# Patient Record
Sex: Female | Born: 1995 | Race: Black or African American | Hispanic: No | Marital: Single | Smoking: Current every day smoker
Health system: Southern US, Community
[De-identification: ages and names within clinical notes are randomized; demographics above are authoritative.]

## PROBLEM LIST (undated history)

## (undated) DIAGNOSIS — E669 Obesity, unspecified: Secondary | ICD-10-CM

## (undated) DIAGNOSIS — G43909 Migraine, unspecified, not intractable, without status migrainosus: Secondary | ICD-10-CM

---
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
## 2017-02-02 ENCOUNTER — Ambulatory Visit: Payer: Medicaid Other | Admitting: Family Medicine

## 2017-02-04 ENCOUNTER — Other Ambulatory Visit: Payer: Self-pay | Admitting: Family Medicine

## 2017-02-25 ENCOUNTER — Encounter: Payer: Self-pay | Admitting: Family Medicine

## 2017-02-25 ENCOUNTER — Ambulatory Visit (INDEPENDENT_AMBULATORY_CARE_PROVIDER_SITE_OTHER): Payer: Medicaid Other | Admitting: Family Medicine

## 2017-02-25 VITALS — BP 110/64 | HR 81 | Temp 98.7°F | Wt 255.0 lb

## 2017-02-25 DIAGNOSIS — R7309 Other abnormal glucose: Secondary | ICD-10-CM | POA: Diagnosis not present

## 2017-02-25 DIAGNOSIS — L309 Dermatitis, unspecified: Secondary | ICD-10-CM | POA: Diagnosis not present

## 2017-02-25 DIAGNOSIS — M722 Plantar fascial fibromatosis: Secondary | ICD-10-CM | POA: Diagnosis not present

## 2017-02-25 LAB — POCT GLYCOSYLATED HEMOGLOBIN (HGB A1C): HEMOGLOBIN A1C: 5.9

## 2017-02-25 MED ORDER — IBUPROFEN 800 MG PO TABS: 800.0000 mg | ORAL_TABLET | Freq: Three times a day (TID) | ORAL | 0 refills | Status: DC | PRN

## 2017-02-25 MED ORDER — PREDNISONE 10 MG PO TABS: 40.0000 mg | ORAL_TABLET | Freq: Every day | ORAL | 0 refills | Status: DC

## 2017-02-25 NOTE — Assessment & Plan Note (Signed)
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 Since patient is already on zyrtec and singular and has been compliant on supportive care, will given steroids - Prednisone 40mg  qd with taper down over the following 3 weeks.

## 2017-02-25 NOTE — Patient Instructions (Addendum)
It was great to see you again!  For your eczema,  - Please take prednisone 40mg  daily for 1 week, then taper down. Start taper: 30mg  for 1 week, then 20mg  for 1 week, then 10mg  for 1 week - Keep taking zyrtec and singular as you have been.  For your plantar fasciitis, - Stretching is important, wear supportive insoles.  - Take ibuprofen as needed  Make a appointment with your PCP to discuss your concerns about being checked for diabetes. In preparation, we got some bloodwork today.  Take care and seek immediate care sooner if you develop any concerns.   Dr. Leland HerElsia J Joshau Code, DO [facility]

## 2017-02-25 NOTE — Progress Notes (Signed)
    Subjective:  Cheryl Bowen is a 21 y.o. female who [facility] today with a chief complaint of itching.   HPI:  Itching Has been "itching all over" for the past 2 weeks. Has eczema at baseline and this feels worse than her usual. States her allergies have flared up recently with the increase in pollen. Has been taking zyrtec and benadryl nightly at bedtime. Has old prescription of kenalog that she has been using diffusely over her body. Otherwise has continued to use Aveeno lotion. No new soaps/detergents/body products. No difficulty breathing or swelling of tongue/lips.  Pain in feet Has had sharp foot pain in bottom of her feet for the past 3 weeks. Starts at heel and radiates to the balls of her feet. Has attempted to wear supportive shoes but due to pain has only been able to wear slip on sandals. No numbness/tingling. No falls.    ROS: Per HPI  Objective:  Physical Exam: BP 110/64   Pulse 81   Temp 98.7 F (37.1 C) (Oral)   Wt 255 lb (115.7 kg)   LMP 02/17/2017 (Approximate)   SpO2 99%   BMI 48.18 kg/m   Gen: NAD, resting comfortably MSK: no edema, cyanosis, or clubbing noted. TTP over heels on b/l feet. No erythema. Skin: warm, dry. Fine papular rash with lichenification over extremities with hyper ketotic lines on palms coupled with fine vesicular eruption. Also with fine vesicular eruption on inner arch of b/l feet.  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 Since patient is already on zyrtec and singular and has been compliant on supportive care, will given steroids - Prednisone 40mg  qd with taper down over the following 3 weeks.   Plantar fasciitis Exam consistent with plantar fasciitis given patient's heel pain.  - PRN ibuprofen - Recommended stretches and supportive care   Leland HerElsia J Merric Yost, DO PGY-1, [facility] 02/25/2017 1:51 PM

## 2017-02-25 NOTE — Assessment & Plan Note (Signed)
Exam consistent with plantar fasciitis given patient's heel pain.  - PRN ibuprofen - Recommended stretches and supportive care

## 2017-03-04 ENCOUNTER — Other Ambulatory Visit: Payer: Self-pay | Admitting: Family Medicine

## 2017-05-04 ENCOUNTER — Other Ambulatory Visit: Payer: Self-pay | Admitting: *Deleted

## 2017-05-04 MED ORDER — CETIRIZINE HCL 10 MG PO TABS: 10.0000 mg | ORAL_TABLET | Freq: Every day | ORAL | 3 refills | Status: DC

## 2017-06-22 ENCOUNTER — Other Ambulatory Visit: Payer: Self-pay | Admitting: Family Medicine

## 2017-06-22 DIAGNOSIS — M722 Plantar fascial fibromatosis: Secondary | ICD-10-CM

## 2017-06-30 ENCOUNTER — Ambulatory Visit: Payer: Medicaid Other | Admitting: Family Medicine

## 2017-07-19 ENCOUNTER — Other Ambulatory Visit: Payer: Self-pay | Admitting: Family Medicine
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
## 2017-07-22 ENCOUNTER — Encounter: Payer: Self-pay | Admitting: Family Medicine

## 2017-07-22 ENCOUNTER — Ambulatory Visit (INDEPENDENT_AMBULATORY_CARE_PROVIDER_SITE_OTHER): Payer: Medicaid Other | Admitting: Family Medicine

## 2017-07-22 VITALS — BP 120/80 | HR 95 | Temp 98.8°F | Wt 238.0 lb

## 2017-07-22 DIAGNOSIS — Z23 Encounter for immunization: Secondary | ICD-10-CM | POA: Diagnosis not present

## 2017-07-22 DIAGNOSIS — L732 Hidradenitis suppurativa: Secondary | ICD-10-CM | POA: Diagnosis not present

## 2017-07-22 MED ORDER — DOXYCYCLINE HYCLATE 100 MG PO TABS: 100.0000 mg | ORAL_TABLET | Freq: Two times a day (BID) | ORAL | 0 refills | Status: DC

## 2017-07-22 NOTE — Progress Notes (Signed)
    Subjective:    Patient ID: Cheryl Bowen, female    DOB: [birth year], 21 y.o.   MRN: [id number]   CC: boils under arms  Patient has chronic hidradenitis. Has seen derm recently several times but stopped going as he "only gave me more antibiotics and never did anything for it". Per patient she has had a lot of pain and swelling under both arms for the past week or so. She is out of doxycycline. Has been using warm compresses, boils drained a little last night providing some relief however the area is still extremely tender and uncomfortable. She is a smoker. She does not shave her armpits. Uses spray on deodorant as anything else hurts too much.   She also has URI symptoms and a cough. No wheezing. No fevers.   Smoking status reviewed- current every day smoker  Review of Systems- no fevers or chills   Objective:  BP 120/80   Pulse 95   Temp 98.8 F (37.1 C) (Oral)   Wt 238 lb (108 kg)   LMP 07/13/2017   SpO2 97%   BMI 44.97 kg/m  Vitals and nursing note reviewed  General: well nourished, in no acute distress HEENT: normocephalic, TM's visualized bilaterally, no scleral icterus or conjunctival pallor, no nasal discharge, moist mucous membranes, good dentition without erythema or discharge noted in posterior oropharynx Neck: supple, non-tender, without lymphadenopathy Cardiac: RRR, clear S1 and S2, no murmurs, rubs, or gallops Respiratory: clear to auscultation bilaterally, no increased work of breathing Extremities: no edema or cyanosis. Skin: warm and dry. Multiple draining pustules in axilla bilaterally with evidence of tracking, swelling, erythema. Tender to palpation.  Neuro: alert and oriented, no focal deficits  Assessment & Plan:    Hidradenitis  Clearly Hurley stage 2 if not 3. Chronic problem since patient was 37-58 years old. Evidence of scarring in bilateral axilla, under breast. Limited amount of normal skin present in axilla. Currently swollen, tender, draining  purulent material.   -refilled doxycycline for 10 day course -advised tylenol, ibuprofen for pain -continue warm compresses, ice for swelling -advised hibiclens to be used in shower weekly -referral made to surgery to discuss possible excision in future -encouraged tobacco cessation, weight loss -follow up as needed with PCP  Return if symptoms worsen or fail to improve.   Dolores Patty, DO Family Medicine Resident PGY-2

## 2017-07-22 NOTE — Patient Instructions (Addendum)
  Please get Hibiclens from any pharmacy and use this on the affected areas once a week in the shower.  We know that quitting smoking and losing weight help cut down infections.   I have referred you to a surgeon to discuss options for more treatment.  Please return to be seen if you develop fevers or chills.  If you have questions or concerns please do not hesitate to call at [phone].  Dolores Patty, DO PGY-2, [facility] 07/22/2017 4:03 PM

## 2017-07-22 NOTE — Assessment & Plan Note (Addendum)
  Clearly Hurley stage 2 if not 3. Chronic problem since patient was 22-19 years old. Evidence of scarring in bilateral axilla, under breast. Limited amount of normal skin present in axilla. Currently swollen, tender, draining purulent material.   -refilled doxycycline for 10 day course -advised tylenol, ibuprofen for pain -continue warm compresses, ice for swelling -advised hibiclens to be used in shower weekly -referral made to surgery to discuss possible excision in future -encouraged tobacco cessation, weight loss -follow up as needed with PCP

## 2017-12-16 ENCOUNTER — Other Ambulatory Visit: Payer: Self-pay | Admitting: *Deleted

## 2017-12-16 MED ORDER — BECLOMETHASONE DIPROP HFA 80 MCG/ACT IN AERB: 1.0000 | INHALATION_SPRAY | Freq: Two times a day (BID) | RESPIRATORY_TRACT | 1 refills | Status: DC

## 2017-12-20 ENCOUNTER — Telehealth: Payer: Self-pay

## 2017-12-20 NOTE — Telephone Encounter (Signed)
Patients insurance is requiring a PA on Qvar- Flovent and Pulmicort are preferred. Do you want to change to a preferred drug or attempt PA? Shawna OrleansMeredith B Thomsen, RN

## 2017-12-21 ENCOUNTER — Other Ambulatory Visit: Payer: Self-pay | Admitting: Family Medicine

## 2017-12-21 MED ORDER — BUDESONIDE 180 MCG/ACT IN AEPB: 2.0000 | INHALATION_SPRAY | Freq: Two times a day (BID) | RESPIRATORY_TRACT | 2 refills | Status: DC

## 2017-12-21 NOTE — Telephone Encounter (Signed)
Pulmicort send to pharmacy   Thank you   Lovena NeighboursAbdoulaye Lesha Jager, MD [facility], PGY-2

## 2017-12-21 NOTE — Telephone Encounter (Signed)
Meredith,   Let's do Pulmicort, so patientget have a medication instead of attempting PA for Qvar.  Thank you  Lovena NeighboursAbdoulaye Valerie Fredin, MD [facility], PGY-2

## 2017-12-21 NOTE — Telephone Encounter (Signed)
Can you send in a new Rx for pulmicort to [facility]? Thanks!! Shawna OrleansMeredith B Thomsen, RN

## 2017-12-22 ENCOUNTER — Telehealth: Payer: Self-pay

## 2017-12-22 ENCOUNTER — Other Ambulatory Visit: Payer: Self-pay | Admitting: Family Medicine

## 2017-12-22 MED ORDER — FLUTICASONE PROPIONATE HFA 110 MCG/ACT IN AERO: 2.0000 | INHALATION_SPRAY | Freq: Two times a day (BID) | RESPIRATORY_TRACT | 3 refills | Status: DC

## 2017-12-22 NOTE — Telephone Encounter (Signed)
Sharyl NimrodMeredith,  I will send flovent HFA inhaler.  Thanks   Lovena NeighboursAbdoulaye Creedon Danielski, MD [facility], PGY-2

## 2017-12-22 NOTE — Telephone Encounter (Signed)
Per patients insurance- will cover pulmicort for nebulizer but not the flexhaler. Does cover Flovent HFA inhaler. Do you want to send in a new Rx for Flovent or attempt a PA? Thanks! Shawna Orleans, RN

## 2018-02-06 ENCOUNTER — Other Ambulatory Visit: Payer: Self-pay | Admitting: Family Medicine

## 2018-02-07 ENCOUNTER — Other Ambulatory Visit: Payer: Self-pay

## 2018-02-07 MED ORDER — MONTELUKAST SODIUM 10 MG PO TABS: ORAL_TABLET | ORAL | 1 refills | Status: DC

## 2018-03-24 ENCOUNTER — Emergency Department (HOSPITAL_BASED_OUTPATIENT_CLINIC_OR_DEPARTMENT_OTHER): Payer: Medicaid Other

## 2018-03-24 ENCOUNTER — Emergency Department (HOSPITAL_BASED_OUTPATIENT_CLINIC_OR_DEPARTMENT_OTHER)
Admission: EM | Admit: 2018-03-24 | Discharge: 2018-03-24 | Disposition: A | Discharge status: Home / Self Care | Payer: Medicaid Other | Attending: Emergency Medicine | Admitting: Emergency Medicine

## 2018-03-24 ENCOUNTER — Other Ambulatory Visit: Payer: Self-pay

## 2018-03-24 ENCOUNTER — Encounter (HOSPITAL_BASED_OUTPATIENT_CLINIC_OR_DEPARTMENT_OTHER): Payer: Self-pay | Admitting: Emergency Medicine

## 2018-03-24 DIAGNOSIS — R05 Cough: Secondary | ICD-10-CM | POA: Diagnosis present

## 2018-03-24 DIAGNOSIS — Z79899 Other long term (current) drug therapy: Secondary | ICD-10-CM | POA: Diagnosis not present

## 2018-03-24 DIAGNOSIS — F17200 Nicotine dependence, unspecified, uncomplicated: Secondary | ICD-10-CM | POA: Insufficient documentation

## 2018-03-24 DIAGNOSIS — R51 Headache: Secondary | ICD-10-CM | POA: Diagnosis not present

## 2018-03-24 DIAGNOSIS — J45901 Unspecified asthma with (acute) exacerbation: Secondary | ICD-10-CM | POA: Diagnosis not present

## 2018-03-24 MED ORDER — PREDNISONE 50 MG PO TABS
60.0000 mg | ORAL_TABLET | Freq: Once | ORAL | Status: AC
  Administered 2018-03-24: 19:00:00 60 mg via ORAL
  Filled 2018-03-24: qty 1

## 2018-03-24 MED ORDER — ALBUTEROL SULFATE (2.5 MG/3ML) 0.083% IN NEBU
2.5000 mg | INHALATION_SOLUTION | Freq: Once | RESPIRATORY_TRACT | Status: AC
  Administered 2018-03-24: 2.5 mg via RESPIRATORY_TRACT
  Filled 2018-03-24: qty 3

## 2018-03-24 MED ORDER — PREDNISONE 20 MG PO TABS: 60.0000 mg | ORAL_TABLET | Freq: Every day | ORAL | 0 refills | Status: DC

## 2018-03-24 NOTE — ED Triage Notes (Signed)
Cough x2 weeks.  Sternal pain with cough that is getting worse and lasting longer.  Inhaler is not working well.

## 2018-03-24 NOTE — ED Provider Notes (Signed)
[facility] EMERGENCY DEPARTMENT Provider Note   CSN: [id number] Arrival date & time: 03/24/18  1747     History   Chief Complaint Chief Complaint  Patient presents with  . Cough    HPI Cheryl Bowen is a 22 y.o. female.  She is a prior history of asthma.  She is complaining of 2 weeks of cough minimally productive of some yellow-green sputum.  She is coughing constantly.  When she coughs it makes her head hurt.  She is using albuterol inhaler with minimal relief.  There is been no fever, no sick contacts, no recent travel.  The history is provided by the patient.  Cough  This is a new problem. Episode onset: 2 weeks. The problem occurs constantly. The problem has not changed since onset.The cough is productive of purulent sputum. There has been no fever. Associated symptoms include headaches, shortness of breath and wheezing. Pertinent negatives include no chest pain, no chills, no ear congestion, no ear pain, no rhinorrhea and no sore throat. She has tried nothing for the symptoms. The treatment provided no relief. She is a smoker. Her past medical history is significant for asthma.    Past Medical History:  Diagnosis Date  . Asthma   . Migraines   . Obesity     Patient Active Problem List   Diagnosis Date Noted  . Plantar fasciitis 02/25/2017  . Otitis media 10/08/2016  . Hip pain, acute, right 09/17/2016  . Physical deconditioning 09/17/2016  . Bilateral pes planus 09/17/2016  . Valgus deformity, not elsewhere classified, unspecified knee 09/17/2016  . Prediabetes 05/11/2016  . Dysmenorrhea in adolescent 07/16/2015  . Bilateral knee pain 01/29/2015  . Chest pain 06/28/2014  . Hidradenitis 09/21/2011  . Morbid obesity (HCC) 05/27/2009  . Asthma 05/27/2009  . Eczema 10/20/2007  . Allergic rhinitis 02/06/2007    History reviewed. No pertinent surgical history.   OB History   None      Home Medications    Prior to Admission medications   Medication  Sig Start Date End Date Taking? Authorizing Provider  beclomethasone (QVAR REDIHALER) 80 MCG/ACT inhaler Inhale 1 puff into the lungs 2 (two) times daily. 12/16/17   Diallo, Lilia Argue, MD  budesonide (PULMICORT FLEXHALER) 180 MCG/ACT inhaler Inhale 2 puffs into the lungs 2 (two) times daily. 12/21/17   Diallo, Lilia Argue, MD  cetirizine (ZYRTEC) 10 MG tablet Take 1 tablet (10 mg total) by mouth at bedtime. 05/04/17   Diallo, Lilia Argue, MD  doxycycline (VIBRA-TABS) 100 MG tablet Take 1 tablet (100 mg total) by mouth 2 (two) times daily. 07/22/17   Tillman Sers, DO  fluticasone (FLONASE) 50 MCG/ACT nasal spray Place 2 sprays into both nostrils daily. During your bad season 12/29/16   Araceli Bouche, DO  fluticasone (FLOVENT DISKUS) 50 MCG/BLIST diskus inhaler Inhale 2 puffs into the lungs 2 (two) times daily. 12/29/16   Rumley, Matinecock N, DO  fluticasone (FLOVENT HFA) 110 MCG/ACT inhaler Inhale 2 puffs into the lungs 2 (two) times daily. 12/22/17   Diallo, Lilia Argue, MD  ibuprofen (ADVIL,MOTRIN) 800 MG tablet TAKE 1 TABLET BY MOUTH EVERY 8 HOURS AS NEEDED 06/22/17   Diallo, Abdoulaye, MD  montelukast (SINGULAIR) 10 MG tablet TAKE 1 TABLET (10 MG TOTAL) BY MOUTH AT BEDTIME. 02/07/18   Diallo, Lilia Argue, MD  predniSONE (DELTASONE) 10 MG tablet Take 4 tablets (40 mg total) by mouth daily with breakfast. Take 40mg  daily for 1 week then taper off. 02/25/17   Leland Her, DO  PROAIR HFA 108 (90 Base) MCG/ACT inhaler INHALE 2 PUFFS INTO THE LUNGS EVERY 6 (SIX) HOURS AS NEEDED FOR WHEEZING. 12/29/16   Rumley, Lora Havens, DO  triamcinolone cream (KENALOG) 0.1 % Apply 1 application topically 3 (three) times daily as needed (itching/inflammation).    [provider]  albuterol (PROVENTIL) 90 MCG/ACT inhaler Inhale 2 puffs into the lungs every 4 (four) hours as needed. Using inhaler   09/06/11  [provider]    Family History Family History  Problem Relation Age of Onset  . Asthma Mother      Social History Social History   Tobacco Use  . Smoking status: Current Some Day Smoker  . Smokeless tobacco: Never Used  Substance Use Topics  . Alcohol use: No  . Drug use: No     Allergies   Other; Doxycycline; Penicillins; and Pollen extract   Review of Systems Review of Systems  Constitutional: Negative for chills and fever.  HENT: Negative for ear pain, rhinorrhea and sore throat.   Eyes: Negative for pain and visual disturbance.  Respiratory: Positive for cough, shortness of breath and wheezing.   Cardiovascular: Negative for chest pain and palpitations.  Gastrointestinal: Negative for abdominal pain and vomiting.  Genitourinary: Negative for dysuria and hematuria.  Musculoskeletal: Negative for arthralgias and back pain.  Skin: Negative for color change and rash.  Neurological: Positive for headaches. Negative for seizures and syncope.  All other systems reviewed and are negative.    Physical Exam Updated Vital Signs BP 127/82 (BP Location: Right Arm)   Pulse 86   Temp 98.4 F (36.9 C) (Oral)   Resp 18   Ht 5\' 1"  (1.549 m)   Wt 95.3 kg (210 lb)   LMP 03/06/2018   SpO2 100%   BMI 39.68 kg/m   Physical Exam  Constitutional: She appears well-developed and well-nourished.  HENT:  Head: Normocephalic and atraumatic.  Right Ear: Tympanic membrane, external ear and ear canal normal.  Left Ear: Tympanic membrane, external ear and ear canal normal.  Nose: Nose normal.  Mouth/Throat: Uvula is midline, oropharynx is clear and moist and mucous membranes are normal.  Eyes: Pupils are equal, round, and reactive to light. Conjunctivae and EOM are normal.  Neck: Normal range of motion. Neck supple.  Cardiovascular: Normal rate, regular rhythm, normal heart sounds and intact distal pulses.  Pulmonary/Chest: Effort normal. She has wheezes (scattered).  Neurological: She is alert. GCS eye subscore is 4. GCS verbal subscore is 5. GCS motor subscore is 6.  Skin:  Skin is warm and dry.  Psychiatric: She has a normal mood and affect.     ED Treatments / Results  Labs (all labs ordered are listed, but only abnormal results are displayed) Labs Reviewed - No data to display  EKG None  Radiology Dg Chest 2 View  Result Date: 03/24/2018 CLINICAL DATA:  Upper chest pain, cough for 2 weeks. EXAM: CHEST - 2 VIEW COMPARISON:  June 26, 2014 FINDINGS: The heart size and mediastinal contours are within normal limits. There is no focal infiltrate, pulmonary edema, or pleural effusion. Scoliosis of the spine is noted. IMPRESSION: No active cardiopulmonary disease. Electronically Signed   By: Sherian Rein M.D.   On: 03/24/2018 18:46    Procedures Procedures (including critical care time)  Medications Ordered in ED Medications  predniSONE (DELTASONE) tablet 60 mg (has no administration in time range)  albuterol (PROVENTIL) (2.5 MG/3ML) 0.083% nebulizer solution 2.5 mg (has no administration in time range)  Initial Impression / Assessment and Plan / ED Course  I have reviewed the triage vital signs and the nursing notes.  Pertinent labs & imaging results that were available during my care of the patient were reviewed by me and considered in my medical decision making (see chart for details).     Final Clinical Impressions(s) / ED Diagnoses   Final diagnoses:  Moderate asthma with exacerbation, unspecified whether persistent    ED Discharge Orders        Ordered    predniSONE (DELTASONE) 20 MG tablet  Daily     03/24/18 1852       Terrilee FilesButler, Michael C, MD 03/25/18 1216

## 2018-03-24 NOTE — Discharge Instructions (Addendum)
Your evaluated in the emergency department for persistent cough over 2 weeks.  This is likely related to your asthma.  Your chest x-ray did not show an obvious pneumonia.  We are patient to me prednisone to take for 4 more days and you should continue to use your inhalers as needed.  You should follow-up with your doctor and return if any worsening symptoms.

## 2018-05-21 ENCOUNTER — Other Ambulatory Visit: Payer: Self-pay | Admitting: Family Medicine

## 2018-07-02 ENCOUNTER — Other Ambulatory Visit: Payer: Self-pay | Admitting: Family Medicine

## 2018-07-02 DIAGNOSIS — M722 Plantar fascial fibromatosis: Secondary | ICD-10-CM

## 2018-07-12 ENCOUNTER — Ambulatory Visit: Payer: Medicaid Other | Admitting: Family Medicine

## 2018-07-16 ENCOUNTER — Other Ambulatory Visit: Payer: Self-pay

## 2018-07-16 ENCOUNTER — Emergency Department (HOSPITAL_COMMUNITY): Payer: Medicaid Other

## 2018-07-16 ENCOUNTER — Emergency Department (HOSPITAL_COMMUNITY)
Admission: EM | Admit: 2018-07-16 | Discharge: 2018-07-17 | Disposition: A | Discharge status: Home / Self Care | Payer: Medicaid Other | Attending: Emergency Medicine | Admitting: Emergency Medicine

## 2018-07-16 ENCOUNTER — Encounter (HOSPITAL_COMMUNITY): Payer: Self-pay | Admitting: Emergency Medicine

## 2018-07-16 DIAGNOSIS — Z79899 Other long term (current) drug therapy: Secondary | ICD-10-CM | POA: Insufficient documentation

## 2018-07-16 DIAGNOSIS — J45909 Unspecified asthma, uncomplicated: Secondary | ICD-10-CM | POA: Insufficient documentation

## 2018-07-16 DIAGNOSIS — F17200 Nicotine dependence, unspecified, uncomplicated: Secondary | ICD-10-CM | POA: Insufficient documentation

## 2018-07-16 DIAGNOSIS — R079 Chest pain, unspecified: Secondary | ICD-10-CM | POA: Diagnosis present

## 2018-07-16 LAB — CBC
HEMATOCRIT: 35.4 % — AB (ref 36.0–46.0)
HEMOGLOBIN: 11.1 g/dL — AB (ref 12.0–15.0)
MCH: 25.2 pg — ABNORMAL LOW (ref 26.0–34.0)
MCHC: 31.4 g/dL (ref 30.0–36.0)
MCV: 80.5 fL (ref 78.0–100.0)
Platelets: 335 10*3/uL (ref 150–400)
RBC: 4.4 MIL/uL (ref 3.87–5.11)
RDW: 14 % (ref 11.5–15.5)
WBC: 6 10*3/uL (ref 4.0–10.5)

## 2018-07-16 LAB — BASIC METABOLIC PANEL
ANION GAP: 8 (ref 5–15)
BUN: 8 mg/dL (ref 6–20)
CHLORIDE: 106 mmol/L (ref 98–111)
CO2: 24 mmol/L (ref 22–32)
Calcium: 9.5 mg/dL (ref 8.9–10.3)
Creatinine, Ser: 0.63 mg/dL (ref 0.44–1.00)
GFR calc Af Amer: >60 mL/min (ref >60)
GFR calc non Af Amer: >60 mL/min (ref >60)
GLUCOSE: 84 mg/dL (ref 70–99)
POTASSIUM: 3.7 mmol/L (ref 3.5–5.1)
Sodium: 138 mmol/L (ref 135–145)

## 2018-07-16 LAB — D-DIMER, QUANTITATIVE (NOT AT ARMC): D DIMER QUANT: 0.69 ug{FEU}/mL — AB (ref 0.00–0.50)

## 2018-07-16 LAB — I-STAT BETA HCG BLOOD, ED (MC, WL, AP ONLY)

## 2018-07-16 LAB — I-STAT TROPONIN, ED: Troponin i, poc: 0 ng/mL (ref 0.00–0.08)

## 2018-07-16 MED ORDER — KETOROLAC TROMETHAMINE 15 MG/ML IJ SOLN
15.0000 mg | Freq: Once | INTRAMUSCULAR | Status: AC
  Administered 2018-07-16: 15 mg via INTRAVENOUS
  Filled 2018-07-16: qty 1

## 2018-07-16 MED ORDER — IPRATROPIUM-ALBUTEROL 0.5-2.5 (3) MG/3ML IN SOLN
3.0000 mL | Freq: Once | RESPIRATORY_TRACT | Status: AC
  Administered 2018-07-16: 3 mL via RESPIRATORY_TRACT
  Filled 2018-07-16: qty 3

## 2018-07-16 MED ORDER — IOPAMIDOL (ISOVUE-370) INJECTION 76%
INTRAVENOUS | Status: AC
  Filled 2018-07-16: qty 100

## 2018-07-16 MED ORDER — IOPAMIDOL (ISOVUE-370) INJECTION 76%
100.0000 mL | Freq: Once | INTRAVENOUS | Status: AC | PRN
  Administered 2018-07-16: 100 mL via INTRAVENOUS

## 2018-07-16 NOTE — ED Notes (Signed)
Patient transported to CT 

## 2018-07-16 NOTE — ED Notes (Signed)
Patient returned from X-ray 

## 2018-07-16 NOTE — ED Provider Notes (Signed)
[facility] EMERGENCY DEPARTMENT Provider Note  CSN: [id number] Arrival date & time: 07/16/18  1944  History   Chief Complaint Chief Complaint  Patient presents with  . Chest Pain   HPI Cheryl Bowen is a 22 y.o. female with a past medical history significant for asthma who presents for evaluation of chest pain and SOB. States her CP began yesterday morning. Her pain is non exertional. States her pain is worse with deep breathing. She awoke this morning with SOB. Has not used her inhalers for her asthma. States she was not able to do her usual morning jog secondary to SOB. Denies fever, chills, nausea, vomiting, abdominal pain, diarrhea, constipation. Denies hx of DM, HTN, hyperlipidemia, family hx of heart disease, hx family of sudden cardiac death.  Denies history of pulmonary embolism, contraceptives, recent surgery or recent immobilization.  HPI  Past Medical History:  Diagnosis Date  . Asthma   . Migraines   . Obesity     Patient Active Problem List   Diagnosis Date Noted  . Plantar fasciitis 02/25/2017  . Otitis media 10/08/2016  . Hip pain, acute, right 09/17/2016  . Physical deconditioning 09/17/2016  . Bilateral pes planus 09/17/2016  . Valgus deformity, not elsewhere classified, unspecified knee 09/17/2016  . Prediabetes 05/11/2016  . Dysmenorrhea in adolescent 07/16/2015  . Bilateral knee pain 01/29/2015  . Chest pain 06/28/2014  . Hidradenitis 09/21/2011  . Morbid obesity (HCC) 05/27/2009  . Asthma 05/27/2009  . Eczema 10/20/2007  . Allergic rhinitis 02/06/2007    History reviewed. No pertinent surgical history.   OB History   None      Home Medications    Prior to Admission medications   Medication Sig Start Date End Date Taking? Authorizing Provider  beclomethasone (QVAR REDIHALER) 80 MCG/ACT inhaler Inhale 1 puff into the lungs 2 (two) times daily. Patient taking differently: Inhale 1 puff into the lungs 2 (two) times daily as  needed (shortness of breath).  12/16/17  Yes Diallo, Abdoulaye, MD  budesonide (PULMICORT FLEXHALER) 180 MCG/ACT inhaler Inhale 2 puffs into the lungs 2 (two) times daily. Patient taking differently: Inhale 2 puffs into the lungs 2 (two) times daily as needed (shortness of breath).  12/21/17  Yes Diallo, Abdoulaye, MD  cetirizine (ZYRTEC) 10 MG tablet Take 1 tablet (10 mg total) by mouth at bedtime. 05/04/17  Yes Diallo, Abdoulaye, MD  FLOVENT HFA 110 MCG/ACT inhaler TAKE 2 PUFFS BY MOUTH TWICE A DAY Patient taking differently: Inhale 2 puffs into the lungs 2 (two) times daily as needed (shortness of breath).  07/03/18  Yes Diallo, Abdoulaye, MD  fluticasone (FLONASE) 50 MCG/ACT nasal spray Place 2 sprays into both nostrils daily. During your bad season Patient taking differently: Place 2 sprays into both nostrils daily as needed for allergies.  12/29/16  Yes Rumley, [place] N, DO  montelukast (SINGULAIR) 10 MG tablet TAKE 1 TABLET BY MOUTH EVERYDAY AT BEDTIME Patient taking differently: Take 10 mg by mouth at bedtime.  05/22/18  Yes Diallo, Abdoulaye, MD  PROAIR HFA 108 (90 Base) MCG/ACT inhaler INHALE 2 PUFFS INTO THE LUNGS EVERY 6 (SIX) HOURS AS NEEDED FOR WHEEZING. Patient taking differently: Inhale 2 puffs into the lungs every 6 (six) hours as needed for wheezing or shortness of breath. . 12/29/16  Yes Rumley, K. I[place] N, DO  triamcinolone cream (KENALOG) 0.1 % Apply 1 application topically 3 (three) times daily as needed (itching/inflammation).   Yes [provider]  fluticasone (FLOVENT DISKUS) 50 MCG/BLIST diskus  inhaler Inhale 2 puffs into the lungs 2 (two) times daily. Patient not taking: Reported on 07/16/2018 12/29/16   Araceli Bouche, DO  ibuprofen (ADVIL,MOTRIN) 800 MG tablet TAKE 1 TABLET BY MOUTH EVERY 8 HOURS AS NEEDED Patient not taking: Reported on 07/16/2018 07/03/18   Lovena Neighbours, MD  predniSONE (DELTASONE) 20 MG tablet Take 3 tablets (60 mg total) by mouth daily. Patient  not taking: Reported on 07/16/2018 03/24/18   Terrilee Files, MD  albuterol (PROVENTIL) 90 MCG/ACT inhaler Inhale 2 puffs into the lungs every 4 (four) hours as needed. Using inhaler   09/06/11  [provider]    Family History Family History  Problem Relation Age of Onset  . Asthma Mother     Social History Social History   Tobacco Use  . Smoking status: Current Some Day Smoker  . Smokeless tobacco: Never Used  Substance Use Topics  . Alcohol use: No  . Drug use: No     Allergies   Other; Doxycycline; Penicillins; and Pollen extract   Review of Systems Review of Systems  Constitutional: Negative for activity change, appetite change, chills, diaphoresis, fatigue, fever and unexpected weight change.  Respiratory: Positive for chest tightness, shortness of breath and wheezing. Negative for cough and stridor.   Cardiovascular: Positive for chest pain. Negative for palpitations and leg swelling.  Gastrointestinal: Negative.   Musculoskeletal: Negative for back pain.  Skin: Negative.   Neurological: Negative for dizziness, weakness and light-headedness.     Physical Exam Updated Vital Signs BP 113/61   Pulse 84   Temp 98.6 F (37 C) (Oral)   Resp (!) 23   Ht 5\' 1"  (1.549 m)   LMP 07/05/2018   SpO2 100%   BMI 39.68 kg/m   Physical Exam  Constitutional: She appears well-developed and well-nourished.  Non-toxic appearance. She does not appear ill. No distress.  HENT:  Head: Atraumatic.  Eyes: Pupils are equal, round, and reactive to light.  Neck: Normal range of motion.  Cardiovascular: Normal rate, regular rhythm and intact distal pulses.  Pulmonary/Chest: No stridor. No respiratory distress.  Decreased breath sounds on left side.  Kumari scattered bilateral wheezes.  Rhonchi or rales.  Left anterior chest wall tenderness to palpation.  Abdominal: She exhibits no distension.  Musculoskeletal: Normal range of motion.  Neurological: She is alert.  Skin:  Skin is warm and dry.  No erythema, ecchymosis, or crepitus to chest wall.  Psychiatric: She has a normal mood and affect.  Nursing note and vitals reviewed.    ED Treatments / Results  Labs (all labs ordered are listed, but only abnormal results are displayed) Labs Reviewed  CBC - Abnormal; Notable for the following components:      Result Value   Hemoglobin 11.1 (*)    HCT 35.4 (*)    MCH 25.2 (*)    All other components within normal limits  D-DIMER, QUANTITATIVE (NOT AT [facility]) - Abnormal; Notable for the following components:   D-Dimer, Quant 0.69 (*)    All other components within normal limits  BASIC METABOLIC PANEL  I-STAT TROPONIN, ED  I-STAT BETA HCG BLOOD, ED ([facility], [facility], [facility] ONLY)    EKG EKG Interpretation  Date/Time:  Sunday July 16 2018 19:48:07 EDT Ventricular Rate:  106 PR Interval:  138 QRS Duration: 94 QT Interval:  352 QTC Calculation: 467 R Axis:   65 Text Interpretation:  Sinus tachycardia Nonspecific T wave abnormality Abnormal ECG Confirmed by Tilden Fossa [phone]) on 07/16/2018 8:31:31  PM   Radiology Dg Chest 2 View  Result Date: 07/16/2018 CLINICAL DATA:  Chest pain, shortness of breath. EXAM: CHEST - 2 VIEW COMPARISON:  Radiographs of March 24, 2018. FINDINGS: The heart size and mediastinal contours are within normal limits. Both lungs are clear. No pneumothorax or pleural effusion is noted. The visualized skeletal structures are unremarkable. IMPRESSION: No active cardiopulmonary disease. Electronically Signed   By: Lupita Raider, M.D.   On: 07/16/2018 20:59    Procedures Procedures (including critical care time)  Medications Ordered in ED Medications  iopamidol (ISOVUE-370) 76 % injection (has no administration in time range)  ipratropium-albuterol (DUONEB) 0.5-2.5 (3) MG/3ML nebulizer solution 3 mL (3 mLs Nebulization Given 07/16/18 2115)  ketorolac (TORADOL) 15 MG/ML injection 15 mg (15 mg Intravenous Given 07/16/18 2221)     Initial  Impression / Assessment and Plan / ED Course  I have reviewed the triage vital signs and the nursing notes as well as past medical history.  Pertinent labs & imaging results that were available during my care of the patient were reviewed by me and considered in my medical decision making (see chart for details).  22 year old otherwise well-appearing female presents for evaluation of chest pain and shortness of breath.  History of asthma.  Decreased breath sounds on left with scattered bilateral wheezing.  Has not used her inhaler for asthma.  Moderate chest wall tenderness to palpation on left.  No history of trauma.  Will obtain labs, EKG and DuoNeb and reevaluate.  On reevaluation after nebulizer treatment patient with normal airflow without wheezing.  She states she still has continued chest pain. Will Give Toradol as this is most likely musculoskeletal nature.  Elevated d-dimer at 0.69.  Will obtain CTA chest to r/o PE as the cause of her pain. Labs without Leukocytosis, EKG with sinus tach, no ST elevation, Chest xray without pneumothorax, widened mediastinum, enlarged heart, pulmonary edema or infiltrates.   Patient's pain most likely musculoskeletal in nature with combination of an asthma exacerbation.  If her CTA is negative for PE plan is for her to DC home with anti-inflammatories for her chest wall pain.  Patient care has been transferred to Sharilyn Sites, PA who will determine ultimate disposition and plan for patient.    Final Clinical Impressions(s) / ED Diagnoses   Final diagnoses:  None    ED Discharge Orders    None       Lunabella Badgett A, PA-C 07/16/18 2252    Tilden Fossa, MD 07/18/18 1424

## 2018-07-16 NOTE — ED Notes (Signed)
Patient ambulatory to bathroom with steady gait at this time 

## 2018-07-16 NOTE — ED Notes (Signed)
CT called, verified IV placement and pt's readiness for CT scan.

## 2018-07-16 NOTE — ED Triage Notes (Addendum)
C/o sharp L sided chest pain x 2 days with SOB.  Denies nausea and vomiting.  Breathing labored on arrival.

## 2018-07-16 NOTE — ED Notes (Signed)
Called lab to see if they have blue tube for d-dimer and they do.

## 2018-07-16 NOTE — ED Provider Notes (Signed)
Assumed care of patient at shift change from PA Henderly.  See prior notes for full H&P.  Briefly, 22 y.o. F with hx of asthma here with chest pain. Initially had wheezing that resolved after duoneb but with continued chest pain.  Found to have chest wall tenderness on prior exam. D-dimer was sent and was positive, otherwise labs and chest x-ray reassuring.  Plan:  CTA chest.  If negative, d/c home.  Results for orders placed or performed during the hospital encounter of 07/16/18  Basic metabolic panel  Result Value Ref Range   Sodium 138 135 - 145 mmol/L   Potassium 3.7 3.5 - 5.1 mmol/L   Chloride 106 98 - 111 mmol/L   CO2 24 22 - 32 mmol/L   Glucose, Bld 84 70 - 99 mg/dL   BUN 8 6 - 20 mg/dL   Creatinine, Ser 1.61 0.44 - 1.00 mg/dL   Calcium 9.5 8.9 - 09.6 mg/dL   GFR calc non Af Amer >60 >60 mL/min   GFR calc Af Amer >60 >60 mL/min   Anion gap 8 5 - 15  CBC  Result Value Ref Range   WBC 6.0 4.0 - 10.5 K/uL   RBC 4.40 3.87 - 5.11 MIL/uL   Hemoglobin 11.1 (L) 12.0 - 15.0 g/dL   HCT 04.5 (L) 40.9 - 81.1 %   MCV 80.5 78.0 - 100.0 fL   MCH 25.2 (L) 26.0 - 34.0 pg   MCHC 31.4 30.0 - 36.0 g/dL   RDW 91.4 78.2 - 95.6 %   Platelets 335 150 - 400 K/uL  D-dimer, quantitative (not at [facility])  Result Value Ref Range   D-Dimer, Quant 0.69 (H) 0.00 - 0.50 ug/mL-FEU  I-stat troponin, ED  Result Value Ref Range   Troponin i, poc 0.00 0.00 - 0.08 ng/mL   Comment 3          I-Stat beta hCG blood, ED  Result Value Ref Range   I-stat hCG, quantitative <5.0 <5 mIU/mL   Comment 3           Dg Chest 2 View  Result Date: 07/16/2018 CLINICAL DATA:  Chest pain, shortness of breath. EXAM: CHEST - 2 VIEW COMPARISON:  Radiographs of March 24, 2018. FINDINGS: The heart size and mediastinal contours are within normal limits. Both lungs are clear. No pneumothorax or pleural effusion is noted. The visualized skeletal structures are unremarkable. IMPRESSION: No active cardiopulmonary disease. Electronically  Signed   By: Lupita Raider, M.D.   On: 07/16/2018 20:59   Ct Angio Chest Pe W And/or Wo Contrast  Result Date: 07/16/2018 CLINICAL DATA:  Lambert Mody left-sided chest pain EXAM: CT ANGIOGRAPHY CHEST WITH CONTRAST TECHNIQUE: Multidetector CT imaging of the chest was performed using the standard protocol during bolus administration of intravenous contrast. Multiplanar CT image reconstructions and MIPs were obtained to evaluate the vascular anatomy. CONTRAST:  ISOVUE-370 IOPAMIDOL (ISOVUE-370) INJECTION 76% COMPARISON:  Radiograph 07/16/2018 FINDINGS: Cardiovascular: Suboptimal opacification of the pulmonary arterial system, limits evaluation for emboli. No definite acute central defect is seen. Nonaneurysmal aorta. No dissection. Normal heart size. No pericardial effusion Mediastinum/Nodes: No enlarged mediastinal, hilar, or axillary lymph nodes. Thyroid gland, trachea, and esophagus demonstrate no significant findings. Small axillary nodes. Lungs/Pleura: Lungs are clear. No pleural effusion or pneumothorax. Upper Abdomen: No acute abnormality. Musculoskeletal: Skin thickening at the left lower chest. At the junction of the inferior left breast and chest wall, there is a partially visualized 2.8 cm superficial fluid collection. No acute  osseous abnormality Review of the MIP images confirms the above findings. IMPRESSION: 1. Limited evaluation for pulmonary emboli due to suboptimal opacification of the pulmonary arterial system. No gross acute central embolus is seen. 2. Clear lung fields 3. At the junction of the left breast inferiorly, laterally and the chest wall, there is skin thickening and a partially visualized 2.8 cm superficial fluid collection. Recommend correlation with direct inspection. Electronically Signed   By: Jasmine Pang M.D.   On: 07/16/2018 23:52   12:05 AM CTA somewhat limited, however no acute central embolus is seen.  There is mention of skin thickening and superficial fluid collection  beneath the left breast, however I do not appreciate this on exam.  Patient continues to have chest wall tenderness without noted deformity.  States pain has improved however after Toradol.  I feel this is most likely chest wall pain, lower suspicion for acute PE.  Her vitals have remained stable without tachycardia or hypoxia.  She is not currently on exogenous estrogens and does not have any significant risk factors for PE.  Feel she is stable for discharge home.  Have recommended that they follow-up closely with primary care doctor.  They will return here for any new or worsening symptoms.   Garlon Hatchet, PA-C 07/17/18 Barnett Abu, MD 07/20/18 1505

## 2018-07-16 NOTE — ED Notes (Signed)
Pt transported to xray from lobby, xray tech knows to bring pt to exam room after xray is complete.

## 2018-07-17 MED ORDER — NAPROXEN 500 MG PO TABS: 500.0000 mg | ORAL_TABLET | Freq: Two times a day (BID) | ORAL | 0 refills | Status: DC

## 2018-07-17 NOTE — Discharge Instructions (Signed)
Take the prescribed medication as directed. °Follow-up with your primary care doctor. °Return to the ED for new or worsening symptoms. °

## 2018-08-18 ENCOUNTER — Encounter: Payer: Medicaid Other | Admitting: Family Medicine

## 2018-08-28 ENCOUNTER — Encounter: Payer: Medicaid Other | Admitting: Family Medicine

## 2018-08-31 ENCOUNTER — Other Ambulatory Visit: Payer: Self-pay | Admitting: *Deleted

## 2018-08-31 MED ORDER — PROAIR HFA 108 (90 BASE) MCG/ACT IN AERS: 2.0000 | INHALATION_SPRAY | Freq: Four times a day (QID) | RESPIRATORY_TRACT | 1 refills | Status: DC | PRN

## 2018-09-06 ENCOUNTER — Encounter: Payer: Medicaid Other | Admitting: Family Medicine

## 2018-09-06 ENCOUNTER — Encounter (HOSPITAL_COMMUNITY): Payer: Self-pay

## 2018-09-06 ENCOUNTER — Ambulatory Visit (HOSPITAL_COMMUNITY)
Admission: EM | Admit: 2018-09-06 | Discharge: 2018-09-06 | Disposition: A | Discharge status: Home / Self Care | Payer: Medicaid Other | Attending: Family Medicine | Admitting: Family Medicine

## 2018-09-06 DIAGNOSIS — L309 Dermatitis, unspecified: Secondary | ICD-10-CM | POA: Diagnosis not present

## 2018-09-06 MED ORDER — PREDNISONE 10 MG (21) PO TBPK: ORAL_TABLET | Freq: Every day | ORAL | 0 refills | Status: DC

## 2018-09-06 MED ORDER — TRIAMCINOLONE ACETONIDE 0.1 % EX CREA: 1.0000 "application " | TOPICAL_CREAM | Freq: Two times a day (BID) | CUTANEOUS | 0 refills | Status: DC

## 2018-09-06 NOTE — ED Triage Notes (Signed)
Pt presents with eczema flare up .

## 2018-09-19 NOTE — ED Provider Notes (Signed)
[facility]   [id number] 09/06/18 Arrival Time: 1412  ASSESSMENT & PLAN:  1. Eczema, unspecified type     Meds ordered this encounter  Medications  . triamcinolone cream (KENALOG) 0.1 %    Sig: Apply 1 application topically 2 (two) times daily.    Dispense:  453.6 g    Refill:  0  . predniSONE (STERAPRED UNI-PAK 21 TAB) 10 MG (21) TBPK tablet    Sig: Take by mouth daily. Take as directed.    Dispense:  21 tablet    Refill:  0   Follow-up Information    Lovena Neighbours, MD.   Specialty:  Family Medicine Why:  As needed. Contact information: [address] [place] [address] [phone]          Reviewed expectations re: course of current medical issues. Questions answered. Outlined signs and symptoms indicating need for more acute intervention. Patient verbalized understanding. After Visit Summary given.   SUBJECTIVE:  Quinnley Sandler is a 22 y.o. female who presents with a skin complaint. Eczema exacerbation.   Location: torso and extremities; long-standing h/o eczema; out of medications Onset: gradual Duration: months; worse over past few weeks Associated pruritis? mild Associated pain? mild Progression: fluctuating a bit  Drainage? No  Known trigger? change in weather; cooler New soaps/lotions/topicals/detergents/environmental exposures? No Contacts with similar? No Recent travel? No  Other associated symptoms: none Therapies tried thus far: OTC creams without relief Arthralgia or myalgia? none Recent illness? none Fever? none No specific aggravating or alleviating factors reported.  ROS: As per HPI.  OBJECTIVE: Vitals:   09/06/18 1517  BP: 126/62  Pulse: 68  Resp: 20  Temp: 98.1 F (36.7 C)  TempSrc: Oral  SpO2: 100%    General appearance: alert; no distress Lungs: clear to auscultation bilaterally Heart: regular rate and rhythm Extremities: no edema Skin: warm and dry; signs of infection: no; significant and diffuse  eczema over extremities and torso Psychological: alert and cooperative; normal mood and affect  Allergies  Allergen Reactions  . Other Anaphylaxis    Bee   . Doxycycline Nausea And Vomiting  . Penicillins Nausea And Vomiting  . Pollen Extract Other (See Comments)    Asthma     Past Medical History:  Diagnosis Date  . Asthma   . Migraines   . Obesity    Social History   Socioeconomic History  . Marital status: Single    Spouse name: Not on file  . Number of children: Not on file  . Years of education: Not on file  . Highest education level: Not on file  Occupational History  . Not on file  Social Needs  . Financial resource strain: Not on file  . Food insecurity:    Worry: Not on file    Inability: Not on file  . Transportation needs:    Medical: Not on file    Non-medical: Not on file  Tobacco Use  . Smoking status: Current Some Day Smoker  . Smokeless tobacco: Never Used  Substance and Sexual Activity  . Alcohol use: No  . Drug use: No  . Sexual activity: Never  Lifestyle  . Physical activity:    Days per week: Not on file    Minutes per session: Not on file  . Stress: Not on file  Relationships  . Social connections:    Talks on phone: Not on file    Gets together: Not on file    Attends religious service: Not on file  Active member of club or organization: Not on file    Attends meetings of clubs or organizations: Not on file    Relationship status: Not on file  . Intimate partner violence:    Fear of current or ex partner: Not on file    Emotionally abused: Not on file    Physically abused: Not on file    Forced sexual activity: Not on file  Other Topics Concern  . Not on file  Social History Narrative  . Not on file   Family History  Problem Relation Age of Onset  . Asthma Mother    History reviewed. No pertinent surgical history.   Mardella LaymanHagler, Corrie Brannen, MD 09/19/18 [phone]

## 2018-10-17 ENCOUNTER — Other Ambulatory Visit: Payer: Self-pay

## 2018-10-17 ENCOUNTER — Encounter (HOSPITAL_COMMUNITY): Payer: Self-pay | Admitting: Emergency Medicine

## 2018-10-17 ENCOUNTER — Emergency Department (HOSPITAL_COMMUNITY): Payer: Medicaid Other

## 2018-10-17 ENCOUNTER — Emergency Department (HOSPITAL_COMMUNITY)
Admission: EM | Admit: 2018-10-17 | Discharge: 2018-10-17 | Disposition: A | Discharge status: Home / Self Care | Payer: Medicaid Other | Attending: Emergency Medicine | Admitting: Emergency Medicine

## 2018-10-17 DIAGNOSIS — R079 Chest pain, unspecified: Secondary | ICD-10-CM | POA: Diagnosis not present

## 2018-10-17 DIAGNOSIS — Z5321 Procedure and treatment not carried out due to patient leaving prior to being seen by health care provider: Secondary | ICD-10-CM | POA: Diagnosis not present

## 2018-10-17 LAB — I-STAT BETA HCG BLOOD, ED (MC, WL, AP ONLY)

## 2018-10-17 LAB — BASIC METABOLIC PANEL
ANION GAP: 11 (ref 5–15)
BUN: 6 mg/dL (ref 6–20)
CALCIUM: 9.5 mg/dL (ref 8.9–10.3)
CO2: 21 mmol/L — ABNORMAL LOW (ref 22–32)
Chloride: 107 mmol/L (ref 98–111)
Creatinine, Ser: 0.71 mg/dL (ref 0.44–1.00)
GLUCOSE: 73 mg/dL (ref 70–99)
POTASSIUM: 3.5 mmol/L (ref 3.5–5.1)
Sodium: 139 mmol/L (ref 135–145)

## 2018-10-17 LAB — CBC
HCT: 36.6 % (ref 36.0–46.0)
Hemoglobin: 11.6 g/dL — ABNORMAL LOW (ref 12.0–15.0)
MCH: 25.3 pg — AB (ref 26.0–34.0)
MCHC: 31.7 g/dL (ref 30.0–36.0)
MCV: 79.7 fL — ABNORMAL LOW (ref 80.0–100.0)
NRBC: 0 % (ref 0.0–0.2)
PLATELETS: 312 10*3/uL (ref 150–400)
RBC: 4.59 MIL/uL (ref 3.87–5.11)
RDW: 15.6 % — ABNORMAL HIGH (ref 11.5–15.5)
WBC: 6.5 10*3/uL (ref 4.0–10.5)

## 2018-10-17 LAB — I-STAT TROPONIN, ED: TROPONIN I, POC: 0 ng/mL (ref 0.00–0.08)

## 2018-10-17 MED ORDER — ACETAMINOPHEN 325 MG PO TABS
650.0000 mg | ORAL_TABLET | Freq: Once | ORAL | Status: AC | PRN
  Administered 2018-10-17: 650 mg via ORAL
  Filled 2018-10-17: qty 2

## 2018-10-17 NOTE — ED Notes (Signed)
Pt called for vitals x1 

## 2018-10-17 NOTE — ED Triage Notes (Signed)
Pt states chest pain on her left side. approx 5 days ago. Pt states now it is a sharper pain. Pt states she is short of breath but walked to the ED.

## 2018-10-17 NOTE — ED Notes (Signed)
Pt called for room x3 with no response 

## 2020-07-16 ENCOUNTER — Emergency Department (HOSPITAL_COMMUNITY)
Admission: EM | Admit: 2020-07-16 | Discharge: 2020-07-16 | Disposition: A | Discharge status: Home / Self Care | Payer: Medicaid Other | Attending: Emergency Medicine | Admitting: Emergency Medicine

## 2020-07-16 ENCOUNTER — Encounter (HOSPITAL_COMMUNITY): Payer: Self-pay | Admitting: *Deleted

## 2020-07-16 ENCOUNTER — Other Ambulatory Visit: Payer: Self-pay

## 2020-07-16 DIAGNOSIS — J45909 Unspecified asthma, uncomplicated: Secondary | ICD-10-CM | POA: Insufficient documentation

## 2020-07-16 DIAGNOSIS — M5431 Sciatica, right side: Secondary | ICD-10-CM | POA: Insufficient documentation

## 2020-07-16 DIAGNOSIS — F17200 Nicotine dependence, unspecified, uncomplicated: Secondary | ICD-10-CM | POA: Insufficient documentation

## 2020-07-16 LAB — CBG MONITORING, ED: Glucose-Capillary: 108 mg/dL — ABNORMAL HIGH (ref 70–99)

## 2020-07-16 MED ORDER — CYCLOBENZAPRINE HCL 10 MG PO TABS: 10.0000 mg | ORAL_TABLET | Freq: Two times a day (BID) | ORAL | 0 refills | Status: DC | PRN

## 2020-07-16 MED ORDER — IBUPROFEN 800 MG PO TABS
800.0000 mg | ORAL_TABLET | Freq: Once | ORAL | Status: AC
  Administered 2020-07-16: 800 mg via ORAL
  Filled 2020-07-16: qty 1

## 2020-07-16 MED ORDER — CYCLOBENZAPRINE HCL 10 MG PO TABS
5.0000 mg | ORAL_TABLET | Freq: Once | ORAL | Status: AC
  Administered 2020-07-16: 5 mg via ORAL
  Filled 2020-07-16: qty 1

## 2020-07-16 NOTE — Discharge Instructions (Signed)
You were seen today for sciatica, please use the attached instructions including the rehab sports medicine instructions.  I want you to follow-up with orthopedics, make an appointment with people today attached above.  You can use ibuprofen as prescribed on the bottle for pain, you can also use Flexeril at night for muscle spasms.  I want you to make sure you do not drink alcohol or drive when taking his medications as it makes you sleepy.  Please come back to the emergency department for any new or worsening concerning symptoms as we spoke about.

## 2020-07-16 NOTE — ED Triage Notes (Addendum)
Pt states for about 3 weeks sharp shooting pain from rt side down leg with some numbness in her foot at times. No B/B problems noted with pain

## 2020-07-16 NOTE — ED Provider Notes (Addendum)
[facility]-EMERGENCY DEPT Provider Note   CSN: [id number] Arrival date & time: 07/16/20  1155     History Chief Complaint  Patient presents with  . Leg Pain    Cheryl Bowen is a 24 y.o. female with pertinent past medical history of obesity that presents the emergency department today for leg pain on her right side for the past 3 weeks.  Patient states that she has sharp shooting pain that occurs from her right hip down into her foot that occurs sporadically.  States that the pain is severe and sharp.  Has tried to take ibuprofen a couple times without relief.  Denies any trauma to her hip or back.  Denies any back pain.  States that she does have some mild numbness in her foot, no tingling.  No foot drop.  Is able to walk normally.  No fevers, chills, nausea, vomiting, IV drug use, saddle paresthesias, dysuria, hematuria, urinary incontinence, urinary retention, history of cancer.  States that she is generally healthy.  Worse when she raises her leg. HPI     Past Medical History:  Diagnosis Date  . Asthma   . Migraines   . Obesity     Patient Active Problem List   Diagnosis Date Noted  . Plantar fasciitis 02/25/2017  . Otitis media 10/08/2016  . Hip pain, acute, right 09/17/2016  . Physical deconditioning 09/17/2016  . Bilateral pes planus 09/17/2016  . Valgus deformity, not elsewhere classified, unspecified knee 09/17/2016  . Prediabetes 05/11/2016  . Dysmenorrhea in adolescent 07/16/2015  . Bilateral knee pain 01/29/2015  . Chest pain 06/28/2014  . Hidradenitis 09/21/2011  . Morbid obesity (HCC) 05/27/2009  . Asthma 05/27/2009  . Eczema 10/20/2007  . Allergic rhinitis 02/06/2007    History reviewed. No pertinent surgical history.   OB History   No obstetric history on file.     Family History  Problem Relation Age of Onset  . Asthma Mother     Social History   Tobacco Use  . Smoking status: Current Some Day Smoker  . Smokeless  tobacco: Never Used  Substance Use Topics  . Alcohol use: No  . Drug use: No    Home Medications Prior to Admission medications   Medication Sig Start Date End Date Taking? Authorizing Provider  beclomethasone (QVAR REDIHALER) 80 MCG/ACT inhaler Inhale 1 puff into the lungs 2 (two) times daily. Patient taking differently: Inhale 1 puff into the lungs 2 (two) times daily as needed (shortness of breath).  12/16/17   Diallo, Lilia ArgueAbdoulaye, MD  budesonide (PULMICORT FLEXHALER) 180 MCG/ACT inhaler Inhale 2 puffs into the lungs 2 (two) times daily. Patient taking differently: Inhale 2 puffs into the lungs 2 (two) times daily as needed (shortness of breath).  12/21/17   Diallo, Lilia ArgueAbdoulaye, MD  cetirizine (ZYRTEC) 10 MG tablet Take 1 tablet (10 mg total) by mouth at bedtime. 05/04/17   Diallo, Lilia ArgueAbdoulaye, MD  cyclobenzaprine (FLEXERIL) 10 MG tablet Take 1 tablet (10 mg total) by mouth 2 (two) times daily as needed for muscle spasms. 07/16/20   Ireene Ballowe, PA-C  FLOVENT HFA 110 MCG/ACT inhaler TAKE 2 PUFFS BY MOUTH TWICE A DAY Patient taking differently: Inhale 2 puffs into the lungs 2 (two) times daily as needed (shortness of breath).  07/03/18   Diallo, Lilia ArgueAbdoulaye, MD  fluticasone (FLONASE) 50 MCG/ACT nasal spray Place 2 sprays into both nostrils daily. During your bad season Patient taking differently: Place 2 sprays into both nostrils daily as needed for allergies.  12/29/16   Rumley, [place] N, DO  fluticasone (FLOVENT DISKUS) 50 MCG/BLIST diskus inhaler Inhale 2 puffs into the lungs 2 (two) times daily. Patient not taking: Reported on 07/16/2018 12/29/16   Araceli Bouche, DO  ibuprofen (ADVIL,MOTRIN) 800 MG tablet TAKE 1 TABLET BY MOUTH EVERY 8 HOURS AS NEEDED Patient not taking: Reported on 07/16/2018 07/03/18   Diallo, Lilia Argue, MD  montelukast (SINGULAIR) 10 MG tablet TAKE 1 TABLET BY MOUTH EVERYDAY AT BEDTIME Patient taking differently: Take 10 mg by mouth at bedtime.  05/22/18   Diallo, Lilia Argue, MD    naproxen (NAPROSYN) 500 MG tablet Take 1 tablet (500 mg total) by mouth 2 (two) times daily with a meal. 07/17/18   Allyne Gee, Rosezella [place], PA-C  predniSONE (STERAPRED UNI-PAK 21 TAB) 10 MG (21) TBPK tablet Take by mouth daily. Take as directed. 09/06/18   Mardella Layman, MD  PROAIR HFA 108 [phone] Base) MCG/ACT inhaler Inhale 2 puffs into the lungs every 6 (six) hours as needed for wheezing or shortness of breath. . 08/31/18   Diallo, Lilia Argue, MD  triamcinolone cream (KENALOG) 0.1 % Apply 1 application topically 2 (two) times daily. 09/06/18   Mardella Layman, MD  albuterol (PROVENTIL) 90 MCG/ACT inhaler Inhale 2 puffs into the lungs every 4 (four) hours as needed. Using inhaler   09/06/11  [provider]    Allergies    Other, Doxycycline, Penicillins, and Pollen extract  Review of Systems   Review of Systems  Constitutional: Negative for diaphoresis, fatigue and fever.  Eyes: Negative for visual disturbance.  Respiratory: Negative for shortness of breath.   Cardiovascular: Negative for chest pain.  Gastrointestinal: Negative for abdominal pain, nausea and vomiting.  Genitourinary: Negative for dysuria, flank pain, hematuria and pelvic pain.  Musculoskeletal: Positive for arthralgias. Negative for back pain, gait problem, joint swelling and myalgias.  Skin: Negative for color change, pallor, rash and wound.  Neurological: Negative for syncope, weakness, light-headedness, numbness and headaches.  Psychiatric/Behavioral: Negative for behavioral problems and confusion.    Physical Exam Updated Vital Signs BP (!) 142/90 (BP Location: Right Arm)   Pulse 73   Temp 98.1 F (36.7 C)   Resp 18   Ht 5\' 1"  (1.549 m)   SpO2 94%   BMI 52.91 kg/m   Physical Exam Constitutional:      General: She is not in acute distress.    Appearance: Normal appearance. She is not ill-appearing, toxic-appearing or diaphoretic.  Cardiovascular:     Rate and Rhythm: Normal rate and regular rhythm.      Pulses: Normal pulses.  Pulmonary:     Effort: Pulmonary effort is normal.     Breath sounds: Normal breath sounds.  Musculoskeletal:        General: Normal range of motion.     Comments: Right hip with tenderness to palpation, no ecchymosis, warmth, erythema or rashes or edema to the hip.  Leg with normal range of motion, normal sensation throughout.  Was able to perform dull versus sharp testing on leg and foot, patient was able to differentiate the 2.  No objective numbness near groin.  PT pulses 2+ bilaterally.  Normal cap refill.  Normal gait. No tenderness to cervical, thoracic or lumbar spine midline.  No paraspinal muscle tenderness to back.  Able to range back in all directions.  Positive leg raise.   Skin:    General: Skin is warm and dry.     Capillary Refill: Capillary refill takes less than 2 seconds.  Neurological:     General: No focal deficit present.     Mental Status: She is alert and oriented to person, place, and time.     Gait: Gait normal.     Comments: Alert. Clear speech. No facial droop. CNIII-XII grossly intact. Bilateral upper and lower extremities' sensation grossly intact. 5/5 symmetric strength with grip strength and with plantar and dorsi flexion bilaterally. Normal finger to nose bilaterally. Negative pronator drift. Negative Romberg sign. Gait is steady and intact.   Psychiatric:        Mood and Affect: Mood normal.        Behavior: Behavior normal.        Thought Content: Thought content normal.     ED Results / Procedures / Treatments   Labs (all labs ordered are listed, but only abnormal results are displayed) Labs Reviewed  CBG MONITORING, ED - Abnormal; Notable for the following components:      Result Value   Glucose-Capillary 108 (*)    All other components within normal limits    EKG None  Radiology No results found.  Procedures Procedures (including critical care time)  Medications Ordered in ED Medications  ibuprofen (ADVIL)  tablet 800 mg (800 mg Oral Given 07/16/20 1411)  cyclobenzaprine (FLEXERIL) tablet 5 mg (5 mg Oral Given 07/16/20 1411)    ED Course  I have reviewed the triage vital signs and the nursing notes.  Pertinent labs & imaging results that were available during my care of the patient were reviewed by me and considered in my medical decision making (see chart for details).    MDM Rules/Calculators/A&P                          Kristinia Nale is a 24 y.o. female with pertinent past medical history of obesity that presents the emergency department today for leg pain on her right side for the past 3 weeks. No back pain. Patient was likely has sciatica, did explain this in depth to patient and mom. Hip without any signs of fracture, normal range of motion to hip, no signs of septic joint or bursitis.  Patient is distally neurovascularly intact, gait normal.  Will give ibuprofen and muscle relaxant here and provide symptomatic treatment.Shared decision making about plain films at this time, patient and mom do not think that is necessary at this time.  Patient afebrile, patient to be discharged at this time.  Symptomatic treatment discussed, will refer to sports medicine/PCP for follow-up.  Did discuss if pain does not go away or if patient starts to have any red flag symptoms which she does not have now to come back to the emergency department.  Doubt need for further emergent work up at this time. I explained the diagnosis and have given explicit precautions to return to the ER including for any other new or worsening symptoms. The patient understands and accepts the medical plan as it's been dictated and I have answered their questions. Discharge instructions concerning home care and prescriptions have been given. The patient is STABLE and is discharged to home in good condition.    Final Clinical Impression(s) / ED Diagnoses Final diagnoses:  Sciatica of right side    Rx / DC Orders ED Discharge Orders           Ordered    cyclobenzaprine (FLEXERIL) 10 MG tablet  2 times daily PRN        07/16/20 1358  Farrel Gordon, PA-C 07/16/20 1524    Margarita Grizzle, MD 07/18/20 1340

## 2021-04-15 ENCOUNTER — Emergency Department (HOSPITAL_COMMUNITY)
Admission: EM | Admit: 2021-04-15 | Discharge: 2021-04-15 | Disposition: A | Discharge status: Home / Self Care | Payer: Self-pay | Attending: Emergency Medicine | Admitting: Emergency Medicine

## 2021-04-15 ENCOUNTER — Emergency Department (HOSPITAL_COMMUNITY): Payer: Self-pay

## 2021-04-15 DIAGNOSIS — K8050 Calculus of bile duct without cholangitis or cholecystitis without obstruction: Secondary | ICD-10-CM

## 2021-04-15 DIAGNOSIS — F17200 Nicotine dependence, unspecified, uncomplicated: Secondary | ICD-10-CM | POA: Insufficient documentation

## 2021-04-15 DIAGNOSIS — K8020 Calculus of gallbladder without cholecystitis without obstruction: Secondary | ICD-10-CM | POA: Insufficient documentation

## 2021-04-15 DIAGNOSIS — K5900 Constipation, unspecified: Secondary | ICD-10-CM | POA: Insufficient documentation

## 2021-04-15 DIAGNOSIS — J45909 Unspecified asthma, uncomplicated: Secondary | ICD-10-CM | POA: Insufficient documentation

## 2021-04-15 DIAGNOSIS — R0602 Shortness of breath: Secondary | ICD-10-CM | POA: Insufficient documentation

## 2021-04-15 LAB — COMPREHENSIVE METABOLIC PANEL
ALT: 10 U/L (ref 0–44)
AST: 12 U/L — ABNORMAL LOW (ref 15–41)
Albumin: 4 g/dL (ref 3.5–5.0)
Alkaline Phosphatase: 40 U/L (ref 38–126)
Anion gap: 6 (ref 5–15)
BUN: 7 mg/dL (ref 6–20)
CO2: 23 mmol/L (ref 22–32)
Calcium: 9.2 mg/dL (ref 8.9–10.3)
Chloride: 111 mmol/L (ref 98–111)
Creatinine, Ser: 0.46 mg/dL (ref 0.44–1.00)
GFR, Estimated: >60 mL/min (ref >60)
Glucose, Bld: 99 mg/dL (ref 70–99)
Potassium: 3.7 mmol/L (ref 3.5–5.1)
Sodium: 140 mmol/L (ref 135–145)
Total Bilirubin: 0.3 mg/dL (ref 0.3–1.2)
Total Protein: 7.4 g/dL (ref 6.5–8.1)

## 2021-04-15 LAB — CBC WITH DIFFERENTIAL/PLATELET
Abs Immature Granulocytes: 0.01 10*3/uL (ref 0.00–0.07)
Basophils Absolute: 0 10*3/uL (ref 0.0–0.1)
Basophils Relative: 0 %
Eosinophils Absolute: 0.3 10*3/uL (ref 0.0–0.5)
Eosinophils Relative: 6 %
HCT: 39.4 % (ref 36.0–46.0)
Hemoglobin: 12.4 g/dL (ref 12.0–15.0)
Immature Granulocytes: 0 %
Lymphocytes Relative: 37 %
Lymphs Abs: 1.9 10*3/uL (ref 0.7–4.0)
MCH: 25.8 pg — ABNORMAL LOW (ref 26.0–34.0)
MCHC: 31.5 g/dL (ref 30.0–36.0)
MCV: 81.9 fL (ref 80.0–100.0)
Monocytes Absolute: 0.4 10*3/uL (ref 0.1–1.0)
Monocytes Relative: 8 %
Neutro Abs: 2.6 10*3/uL (ref 1.7–7.7)
Neutrophils Relative %: 49 %
Platelets: 255 10*3/uL (ref 150–400)
RBC: 4.81 MIL/uL (ref 3.87–5.11)
RDW: 15.1 % (ref 11.5–15.5)
WBC: 5.2 10*3/uL (ref 4.0–10.5)
nRBC: 0 % (ref 0.0–0.2)

## 2021-04-15 LAB — URINALYSIS, ROUTINE W REFLEX MICROSCOPIC
Bilirubin Urine: NEGATIVE
Glucose, UA: NEGATIVE mg/dL
Hgb urine dipstick: NEGATIVE
Ketones, ur: NEGATIVE mg/dL
Nitrite: NEGATIVE
Protein, ur: NEGATIVE mg/dL
Specific Gravity, Urine: 1.021 (ref 1.005–1.030)
pH: 6 (ref 5.0–8.0)

## 2021-04-15 LAB — LIPASE, BLOOD: Lipase: 28 U/L (ref 11–51)

## 2021-04-15 LAB — PREGNANCY, URINE: Preg Test, Ur: NEGATIVE

## 2021-04-15 MED ORDER — ONDANSETRON 8 MG PO TBDP
8.0000 mg | ORAL_TABLET | Freq: Once | ORAL | Status: AC
  Administered 2021-04-15: 11:00:00 8 mg via ORAL
  Filled 2021-04-15: qty 1

## 2021-04-15 MED ORDER — DIPHENHYDRAMINE HCL 25 MG PO CAPS
25.0000 mg | ORAL_CAPSULE | Freq: Once | ORAL | Status: AC
  Administered 2021-04-15: 12:00:00 25 mg via ORAL
  Filled 2021-04-15: qty 1

## 2021-04-15 MED ORDER — NAPROXEN 500 MG PO TABS: 500.0000 mg | ORAL_TABLET | Freq: Two times a day (BID) | ORAL | 0 refills | Status: DC

## 2021-04-15 MED ORDER — ONDANSETRON HCL 4 MG PO TABS: 4.0000 mg | ORAL_TABLET | Freq: Four times a day (QID) | ORAL | 0 refills | Status: DC

## 2021-04-15 MED ORDER — MORPHINE SULFATE (PF) 4 MG/ML IV SOLN
4.0000 mg | Freq: Once | INTRAVENOUS | Status: AC
  Administered 2021-04-15: 11:00:00 4 mg via INTRAMUSCULAR
  Filled 2021-04-15: qty 1

## 2021-04-15 MED ORDER — FENTANYL CITRATE (PF) 100 MCG/2ML IJ SOLN
50.0000 ug | Freq: Once | INTRAMUSCULAR | Status: AC
  Administered 2021-04-15: 12:00:00 50 ug via INTRAVENOUS
  Filled 2021-04-15: qty 2

## 2021-04-15 NOTE — ED Triage Notes (Signed)
C/o sharp pains that radiate from flank to right lower abdomen that started 3 months ago after pulling a muscle in her side. It then eased off a bit.   Patient reports the pain will shoot down her leg when she stands up or while walking a random times.   A/ox[facility] Ambulatory in triage.

## 2021-04-15 NOTE — ED Provider Notes (Signed)
[facility]-EMERGENCY DEPT Provider Note   CSN: [id number] Arrival date & time: 04/15/21  0911     History Chief Complaint  Patient presents with   Abdominal Pain    Cheryl Bowen is a 25 y.o. female.  HPI  Patient presents with 2 months of intermittent right upper quadrant pain.  Describes the pain as a burning pain that comes and goes.  It is worse after she eats, when she lays down flat, after large meals.  There are some shortness of breath associated with the pain when it comes on.  She tried Tylenol without relief.  Denies any fever, dysuria, hematuria, vomiting.  Does endorse nausea.  No history of abdominal surgeries.  Past Medical History:  Diagnosis Date   Asthma    Migraines    Obesity     Patient Active Problem List   Diagnosis Date Noted   Plantar fasciitis 02/25/2017   Otitis media 10/08/2016   Hip pain, acute, right 09/17/2016   Physical deconditioning 09/17/2016   Bilateral pes planus 09/17/2016   Valgus deformity, not elsewhere classified, unspecified knee 09/17/2016   Prediabetes 05/11/2016   Dysmenorrhea in adolescent 07/16/2015   Bilateral knee pain 01/29/2015   Chest pain 06/28/2014   Hidradenitis 09/21/2011   Morbid obesity (HCC) 05/27/2009   Asthma 05/27/2009   Eczema 10/20/2007   Allergic rhinitis 02/06/2007    No past surgical history on file.   OB History   No obstetric history on file.     Family History  Problem Relation Age of Onset   Asthma Mother     Social History   Tobacco Use   Smoking status: Some Days    Pack years: 0.00   Smokeless tobacco: Never  Substance Use Topics   Alcohol use: No   Drug use: No    Home Medications Prior to Admission medications   Medication Sig Start Date End Date Taking? Authorizing Provider  naproxen (NAPROSYN) 500 MG tablet Take 1 tablet (500 mg total) by mouth 2 (two) times daily. 04/15/21  Yes Theron Arista, PA-C  ondansetron (ZOFRAN) 4 MG tablet Take 1 tablet (4  mg total) by mouth every 6 (six) hours. 04/15/21  Yes Theron Arista, PA-C  beclomethasone (QVAR REDIHALER) 80 MCG/ACT inhaler Inhale 1 puff into the lungs 2 (two) times daily. Patient taking differently: Inhale 1 puff into the lungs 2 (two) times daily as needed (shortness of breath).  12/16/17   Diallo, Lilia Argue, MD  budesonide (PULMICORT FLEXHALER) 180 MCG/ACT inhaler Inhale 2 puffs into the lungs 2 (two) times daily. Patient taking differently: Inhale 2 puffs into the lungs 2 (two) times daily as needed (shortness of breath).  12/21/17   Diallo, Lilia Argue, MD  cetirizine (ZYRTEC) 10 MG tablet Take 1 tablet (10 mg total) by mouth at bedtime. 05/04/17   Diallo, Lilia Argue, MD  cyclobenzaprine (FLEXERIL) 10 MG tablet Take 1 tablet (10 mg total) by mouth 2 (two) times daily as needed for muscle spasms. 07/16/20   Patel, Shalyn, PA-C  FLOVENT HFA 110 MCG/ACT inhaler TAKE 2 PUFFS BY MOUTH TWICE A DAY Patient taking differently: Inhale 2 puffs into the lungs 2 (two) times daily as needed (shortness of breath).  07/03/18   Diallo, Lilia Argue, MD  fluticasone (FLONASE) 50 MCG/ACT nasal spray Place 2 sprays into both nostrils daily. During your bad season Patient taking differently: Place 2 sprays into both nostrils daily as needed for allergies.  12/29/16   Rumley, [place] N, DO  fluticasone (FLOVENT DISKUS) 50  MCG/BLIST diskus inhaler Inhale 2 puffs into the lungs 2 (two) times daily. Patient not taking: Reported on 07/16/2018 12/29/16   Araceli Bouche, DO  ibuprofen (ADVIL,MOTRIN) 800 MG tablet TAKE 1 TABLET BY MOUTH EVERY 8 HOURS AS NEEDED Patient not taking: Reported on 07/16/2018 07/03/18   Diallo, Lilia Argue, MD  montelukast (SINGULAIR) 10 MG tablet TAKE 1 TABLET BY MOUTH EVERYDAY AT BEDTIME Patient taking differently: Take 10 mg by mouth at bedtime.  05/22/18   Diallo, Lilia Argue, MD  predniSONE (STERAPRED UNI-PAK 21 TAB) 10 MG (21) TBPK tablet Take by mouth daily. Take as directed. 09/06/18   Mardella Layman, MD   PROAIR HFA 108 [phone] Base) MCG/ACT inhaler Inhale 2 puffs into the lungs every 6 (six) hours as needed for wheezing or shortness of breath. . 08/31/18   Diallo, Lilia Argue, MD  triamcinolone cream (KENALOG) 0.1 % Apply 1 application topically 2 (two) times daily. 09/06/18   Mardella Layman, MD  albuterol (PROVENTIL) 90 MCG/ACT inhaler Inhale 2 puffs into the lungs every 4 (four) hours as needed. Using inhaler   09/06/11  [provider]    Allergies    Other, Doxycycline, Penicillins, and Pollen extract  Review of Systems   Review of Systems  Constitutional:  Negative for fever.  Gastrointestinal:  Positive for abdominal pain, constipation and nausea. Negative for vomiting.  Genitourinary:  Negative for dysuria and frequency.  Musculoskeletal:  Positive for back pain.   Physical Exam Updated Vital Signs BP 130/85 (BP Location: Right Arm)   Pulse 82   Temp 98.7 F (37.1 C) (Oral)   Resp 18   LMP 03/23/2021   SpO2 100%   Physical Exam Vitals and nursing note reviewed. Exam conducted with a chaperone present.  Constitutional:      Appearance: Normal appearance.  HENT:     Head: Normocephalic and atraumatic.  Eyes:     General: No scleral icterus.       Right eye: No discharge.        Left eye: No discharge.     Extraocular Movements: Extraocular movements intact.     Pupils: Pupils are equal, round, and reactive to light.  Cardiovascular:     Rate and Rhythm: Normal rate and regular rhythm.     Pulses: Normal pulses.     Heart sounds: Normal heart sounds. No murmur heard.   No friction rub. No gallop.  Pulmonary:     Effort: Pulmonary effort is normal. No respiratory distress.     Breath sounds: Normal breath sounds.  Abdominal:     General: Abdomen is flat. Bowel sounds are normal. There is no distension.     Palpations: Abdomen is soft.     Tenderness: There is abdominal tenderness in the right upper quadrant. There is no guarding or rebound. Positive signs  include Murphy's sign.  Skin:    General: Skin is warm and dry.     Coloration: Skin is not jaundiced.  Neurological:     Mental Status: She is alert. Mental status is at baseline.     Coordination: Coordination normal.    ED Results / Procedures / Treatments   Labs (all labs ordered are listed, but only abnormal results are displayed) Labs Reviewed  CBC WITH DIFFERENTIAL/PLATELET - Abnormal; Notable for the following components:      Result Value   MCH 25.8 (*)    All other components within normal limits  COMPREHENSIVE METABOLIC PANEL - Abnormal; Notable for the following components:   AST  12 (*)    All other components within normal limits  URINALYSIS, ROUTINE W REFLEX MICROSCOPIC - Abnormal; Notable for the following components:   APPearance HAZY (*)    Leukocytes,Ua TRACE (*)    Bacteria, UA RARE (*)    All other components within normal limits  LIPASE, BLOOD  PREGNANCY, URINE    EKG None  Radiology US Abdomen Limited  Result Date: 04/15/2021 CLINICAL DATA:  Abdominal pain, concern for gallstone EXAM: ULTRASOUND ABDOMEN LIMITED RIGHT UPPER QUADRANT COMPARISON:  None. FINDINGS: Gallbladder: Gallbladder appears collapsed containing multiple echogenic shadowing gallstones (wall echo shadow sign). Wall thickness roughly estimated at 2.9 mm. Murphy's sign is elicited over the gallbladder. No pericholecystic fluid. Common bile duct: Diameter: 4.6 mm Liver: No focal lesion identified. Within normal limits in parenchymal echogenicity. Portal vein is patent on color Doppler imaging with normal direction of blood flow towards the liver. Other: No free fluid or ascites Limited because of bowel gas IMPRESSION: Collapsed gallbladder appearing to contain multiple echogenic shadowing gallstones (wall echo shadow sign). Nonspecific associated sonographic Murphy's sign. No biliary dilatation Electronically Signed   By: Judie PetitM.  Shick M.D.   On: 04/15/2021 11:24    Procedures Procedures    Medications Ordered in ED Medications  ondansetron (ZOFRAN-ODT) disintegrating tablet 8 mg (8 mg Oral Given 04/15/21 1039)  morphine 4 MG/ML injection 4 mg (4 mg Intramuscular Given 04/15/21 1039)  fentaNYL (SUBLIMAZE) injection 50 mcg (50 mcg Intravenous Given 04/15/21 1218)  diphenhydrAMINE (BENADRYL) capsule 25 mg (25 mg Oral Given 04/15/21 1217)    ED Course  I have reviewed the triage vital signs and the nursing notes.  Pertinent labs & imaging results that were available during my care of the patient were reviewed by me and considered in my medical decision making (see chart for details).  Clinical Course as of 04/15/21 1301  Wed Apr 15, 2021  1148 WBC: 5.2 No elevated white blood cell count or fever or tachycardia concerning for cholecystitis. [HS]  1243 Pain is improved in the ED.  Patient no longer nauseated. [HS]  1256 No nitrates, no UTI symptoms.  Doubt UTI [HS]    Clinical Course User Index [HS] Theron AristaSage, Gleason Ardoin, PA-C   MDM Rules/Calculators/A&P                          Patient presents with colicky abdominal pain.  Cholelithiasis is high on the differential, also considering gastritis, UTI, ectopic pregnancy, pancreatitis.  Will initiate work-up including ultrasound of the abdomen.    Ultrasound is notable for gallstones, nonobstructive.  Patient afebrile, not tachycardic, no elevated white blood cell count-doubt cholecystitis.  Discussed with patient, she is agreeable to symptomatic treatment and follow-up with general surgery.  Discussed return precautions.  Patient voices understanding and agreement with the plan.  Also complains of constipation.  Went over first-line treatment of constipation.  Patient agreeable to plan.  Final Clinical Impression(s) / ED Diagnoses Final diagnoses:  Biliary colic  Constipation, unspecified constipation type    Rx / DC Orders ED Discharge Orders          Ordered    ondansetron (ZOFRAN) 4 MG tablet  Every 6 hours        04/15/21  1208    naproxen (NAPROSYN) 500 MG tablet  2 times daily        04/15/21 1208             Theron AristaSage, Mikesha Migliaccio, [place] JerseyPA-C 04/15/21 1302  Alvira Monday, MD 04/16/21 [phone]

## 2021-04-15 NOTE — Discharge Instructions (Addendum)
You were evaluated in the ED today and diagnosed with gallstones.  As we discussed, you should follow-up with general surgery to schedule outpatient evaluation and surgical removal of the gallbladder.  In the meantime, please take Zofran as needed for nausea and vomiting.  You may also take naproxen as needed for pain control twice daily.  Please take naproxen with food and water.  Continue to drink fluids, stick with bland foods and avoid spicy or fatty meals until your evaluation.  If condition change or worsen please return back to the emergency department for further evaluation.  Regarding the constipation, please take MiraLAX daily.  Increase to 2 scoops a day.  Take fiber supplements and drink plenty of water.  May also take docusate for stool softener.  This should make your bowel movements more regular.  If this fails to help, please follow-up with your PCP for further evaluation.

## 2021-08-01 ENCOUNTER — Other Ambulatory Visit: Payer: Self-pay

## 2021-08-01 ENCOUNTER — Encounter (HOSPITAL_BASED_OUTPATIENT_CLINIC_OR_DEPARTMENT_OTHER): Payer: Self-pay | Admitting: Emergency Medicine

## 2021-08-01 ENCOUNTER — Emergency Department (HOSPITAL_BASED_OUTPATIENT_CLINIC_OR_DEPARTMENT_OTHER)
Admission: EM | Admit: 2021-08-01 | Discharge: 2021-08-02 | Disposition: A | Discharge status: Home / Self Care | Payer: Self-pay | Attending: Emergency Medicine | Admitting: Emergency Medicine

## 2021-08-01 ENCOUNTER — Emergency Department (HOSPITAL_BASED_OUTPATIENT_CLINIC_OR_DEPARTMENT_OTHER): Payer: Self-pay

## 2021-08-01 DIAGNOSIS — F17200 Nicotine dependence, unspecified, uncomplicated: Secondary | ICD-10-CM | POA: Insufficient documentation

## 2021-08-01 DIAGNOSIS — R1011 Right upper quadrant pain: Secondary | ICD-10-CM

## 2021-08-01 DIAGNOSIS — J45909 Unspecified asthma, uncomplicated: Secondary | ICD-10-CM | POA: Insufficient documentation

## 2021-08-01 DIAGNOSIS — K8020 Calculus of gallbladder without cholecystitis without obstruction: Secondary | ICD-10-CM | POA: Insufficient documentation

## 2021-08-01 DIAGNOSIS — E669 Obesity, unspecified: Secondary | ICD-10-CM | POA: Insufficient documentation

## 2021-08-01 LAB — COMPREHENSIVE METABOLIC PANEL
ALT: 9 U/L (ref 0–44)
AST: 14 U/L — ABNORMAL LOW (ref 15–41)
Albumin: 3.9 g/dL (ref 3.5–5.0)
Alkaline Phosphatase: 49 U/L (ref 38–126)
Anion gap: 6 (ref 5–15)
BUN: 8 mg/dL (ref 6–20)
CO2: 25 mmol/L (ref 22–32)
Calcium: 9 mg/dL (ref 8.9–10.3)
Chloride: 108 mmol/L (ref 98–111)
Creatinine, Ser: 0.54 mg/dL (ref 0.44–1.00)
GFR, Estimated: >60 mL/min (ref >60)
Glucose, Bld: 84 mg/dL (ref 70–99)
Potassium: 3.5 mmol/L (ref 3.5–5.1)
Sodium: 139 mmol/L (ref 135–145)
Total Bilirubin: 0.2 mg/dL — ABNORMAL LOW (ref 0.3–1.2)
Total Protein: 7.5 g/dL (ref 6.5–8.1)

## 2021-08-01 LAB — URINALYSIS, MICROSCOPIC (REFLEX): RBC / HPF: >50 RBC/hpf (ref 0–5)

## 2021-08-01 LAB — CBC WITH DIFFERENTIAL/PLATELET
Abs Immature Granulocytes: 0.01 10*3/uL (ref 0.00–0.07)
Basophils Absolute: 0 10*3/uL (ref 0.0–0.1)
Basophils Relative: 0 %
Eosinophils Absolute: 0.3 10*3/uL (ref 0.0–0.5)
Eosinophils Relative: 4 %
HCT: 36 % (ref 36.0–46.0)
Hemoglobin: 11.6 g/dL — ABNORMAL LOW (ref 12.0–15.0)
Immature Granulocytes: 0 %
Lymphocytes Relative: 36 %
Lymphs Abs: 2.2 10*3/uL (ref 0.7–4.0)
MCH: 26.2 pg (ref 26.0–34.0)
MCHC: 32.2 g/dL (ref 30.0–36.0)
MCV: 81.3 fL (ref 80.0–100.0)
Monocytes Absolute: 0.5 10*3/uL (ref 0.1–1.0)
Monocytes Relative: 8 %
Neutro Abs: 3.1 10*3/uL (ref 1.7–7.7)
Neutrophils Relative %: 52 %
Platelets: 264 10*3/uL (ref 150–400)
RBC: 4.43 MIL/uL (ref 3.87–5.11)
RDW: 14.9 % (ref 11.5–15.5)
WBC: 6 10*3/uL (ref 4.0–10.5)
nRBC: 0 % (ref 0.0–0.2)

## 2021-08-01 LAB — URINALYSIS, ROUTINE W REFLEX MICROSCOPIC
Bilirubin Urine: NEGATIVE
Glucose, UA: NEGATIVE mg/dL
Ketones, ur: NEGATIVE mg/dL
Leukocytes,Ua: NEGATIVE
Nitrite: NEGATIVE
Protein, ur: NEGATIVE mg/dL
Specific Gravity, Urine: 1.025 (ref 1.005–1.030)
pH: 6.5 (ref 5.0–8.0)

## 2021-08-01 LAB — PREGNANCY, URINE: Preg Test, Ur: NEGATIVE

## 2021-08-01 LAB — LIPASE, BLOOD: Lipase: 45 U/L (ref 11–51)

## 2021-08-01 MED ORDER — MORPHINE SULFATE (PF) 4 MG/ML IV SOLN
4.0000 mg | Freq: Once | INTRAVENOUS | Status: AC
  Administered 2021-08-01: 4 mg via INTRAVENOUS
  Filled 2021-08-01: qty 1

## 2021-08-01 MED ORDER — KETOROLAC TROMETHAMINE 30 MG/ML IJ SOLN
30.0000 mg | Freq: Once | INTRAMUSCULAR | Status: AC
  Administered 2021-08-01: 30 mg via INTRAVENOUS
  Filled 2021-08-01: qty 1

## 2021-08-01 MED ORDER — OXYCODONE-ACETAMINOPHEN 5-325 MG PO TABS: 1.0000 | ORAL_TABLET | Freq: Four times a day (QID) | ORAL | 0 refills | Status: DC | PRN

## 2021-08-01 MED ORDER — IOHEXOL 300 MG/ML  SOLN
100.0000 mL | Freq: Once | INTRAMUSCULAR | Status: AC | PRN
  Administered 2021-08-01: 100 mL via INTRAVENOUS

## 2021-08-01 MED ORDER — OXYCODONE-ACETAMINOPHEN 5-325 MG PO TABS
1.0000 | ORAL_TABLET | Freq: Once | ORAL | Status: AC
  Administered 2021-08-01: 1 via ORAL
  Filled 2021-08-01: qty 1

## 2021-08-01 MED ORDER — ONDANSETRON HCL 4 MG/2ML IJ SOLN
4.0000 mg | Freq: Once | INTRAMUSCULAR | Status: AC
  Administered 2021-08-01: 4 mg via INTRAVENOUS
  Filled 2021-08-01: qty 2

## 2021-08-01 MED ORDER — DIPHENHYDRAMINE HCL 50 MG/ML IJ SOLN
12.5000 mg | Freq: Once | INTRAMUSCULAR | Status: AC
  Administered 2021-08-01: 12.5 mg via INTRAVENOUS
  Filled 2021-08-01: qty 1

## 2021-08-01 NOTE — Discharge Instructions (Signed)
You were seen in the ER today for your right upper abdominal pain.  Your blood work and imaging was very reassuring.  You do have gallstones and spit suspect your pain is secondary to biliary colic caused by these gallstones, however there is no infection of your gallbladder or inflammation at this time.  Please follow-up with the surgeons listed below next week to discuss possible removal of your gallbladder.  Return to the ER with any new nausea or vomiting since stopped, diarrhea, fevers, chills, or any other new severe symptom.

## 2021-08-01 NOTE — ED Provider Notes (Signed)
[facility] EMERGENCY DEPARTMENT Provider Note   CSN: [id number] Arrival date & time: 08/01/21  1935     History Chief Complaint  Patient presents with   Abdominal Pain    Cheryl Bowen is a 25 y.o. female who presents with concern for 2 days of worsening right upper abdominal pain that now is also radiating to the right arm and shoulder.  Patient does additionally have right lower abdominal pain with associated nausea without vomiting.  No fevers or chills at home.  Patient with history of gallstones diagnosed few months ago in the emergency department.  Has not followed up with surgery.  Pain is worse postprandially.  Used to resolve on its own but now with pain around for multiple hours at a time.  I personally reviewed this patient's medical records.  She is history of obesity, dysmenorrhea, and prediabetes.  She has history of asthma and migraines.  She is not on any medications every day.  HPI     Past Medical History:  Diagnosis Date   Asthma    Migraines    Obesity     Patient Active Problem List   Diagnosis Date Noted   Plantar fasciitis 02/25/2017   Otitis media 10/08/2016   Hip pain, acute, right 09/17/2016   Physical deconditioning 09/17/2016   Bilateral pes planus 09/17/2016   Valgus deformity, not elsewhere classified, unspecified knee 09/17/2016   Prediabetes 05/11/2016   Dysmenorrhea in adolescent 07/16/2015   Bilateral knee pain 01/29/2015   Chest pain 06/28/2014   Hidradenitis 09/21/2011   Morbid obesity (HCC) 05/27/2009   Asthma 05/27/2009   Eczema 10/20/2007   Allergic rhinitis 02/06/2007    No past surgical history on file.   OB History   No obstetric history on file.     Family History  Problem Relation Age of Onset   Asthma Mother     Social History   Tobacco Use   Smoking status: Some Days   Smokeless tobacco: Never  Substance Use Topics   Alcohol use: No   Drug use: No    Home Medications Prior to Admission  medications   Medication Sig Start Date End Date Taking? Authorizing Provider  oxyCODONE-acetaminophen (PERCOCET/ROXICET) 5-325 MG tablet Take 1 tablet by mouth every 6 (six) hours as needed for severe pain. 08/01/21  Yes Eliyanah Elgersma, Lupe Carney R, PA-C  beclomethasone (QVAR REDIHALER) 80 MCG/ACT inhaler Inhale 1 puff into the lungs 2 (two) times daily. Patient taking differently: Inhale 1 puff into the lungs 2 (two) times daily as needed (shortness of breath).  12/16/17   Diallo, Lilia Argue, MD  budesonide (PULMICORT FLEXHALER) 180 MCG/ACT inhaler Inhale 2 puffs into the lungs 2 (two) times daily. Patient taking differently: Inhale 2 puffs into the lungs 2 (two) times daily as needed (shortness of breath).  12/21/17   Diallo, Lilia Argue, MD  cetirizine (ZYRTEC) 10 MG tablet Take 1 tablet (10 mg total) by mouth at bedtime. 05/04/17   Diallo, Lilia Argue, MD  cyclobenzaprine (FLEXERIL) 10 MG tablet Take 1 tablet (10 mg total) by mouth 2 (two) times daily as needed for muscle spasms. 07/16/20   Patel, Shalyn, PA-C  FLOVENT HFA 110 MCG/ACT inhaler TAKE 2 PUFFS BY MOUTH TWICE A DAY Patient taking differently: Inhale 2 puffs into the lungs 2 (two) times daily as needed (shortness of breath).  07/03/18   Diallo, Lilia Argue, MD  fluticasone (FLONASE) 50 MCG/ACT nasal spray Place 2 sprays into both nostrils daily. During your bad season Patient taking differently: Place 2  sprays into both nostrils daily as needed for allergies.  12/29/16   Rumley, Monroe N, DO  fluticasone (FLOVENT DISKUS) 50 MCG/BLIST diskus inhaler Inhale 2 puffs into the lungs 2 (two) times daily. Patient not taking: Reported on 07/16/2018 12/29/16   Araceli Bouche, DO  ibuprofen (ADVIL,MOTRIN) 800 MG tablet TAKE 1 TABLET BY MOUTH EVERY 8 HOURS AS NEEDED Patient not taking: Reported on 07/16/2018 07/03/18   Diallo, Lilia Argue, MD  montelukast (SINGULAIR) 10 MG tablet TAKE 1 TABLET BY MOUTH EVERYDAY AT BEDTIME Patient taking differently: Take 10 mg by  mouth at bedtime.  05/22/18   Diallo, Lilia Argue, MD  naproxen (NAPROSYN) 500 MG tablet Take 1 tablet (500 mg total) by mouth 2 (two) times daily. 04/15/21   Theron Arista, PA-C  ondansetron (ZOFRAN) 4 MG tablet Take 1 tablet (4 mg total) by mouth every 6 (six) hours. 04/15/21   Theron Arista, PA-C  predniSONE (STERAPRED UNI-PAK 21 TAB) 10 MG (21) TBPK tablet Take by mouth daily. Take as directed. 09/06/18   Mardella Layman, MD  PROAIR HFA 108 [phone] Base) MCG/ACT inhaler Inhale 2 puffs into the lungs every 6 (six) hours as needed for wheezing or shortness of breath. . 08/31/18   Diallo, Lilia Argue, MD  triamcinolone cream (KENALOG) 0.1 % Apply 1 application topically 2 (two) times daily. 09/06/18   Mardella Layman, MD  albuterol (PROVENTIL) 90 MCG/ACT inhaler Inhale 2 puffs into the lungs every 4 (four) hours as needed. Using inhaler   09/06/11  [provider]    Allergies    Other, Doxycycline, Penicillins, and Pollen extract  Review of Systems   Review of Systems  Constitutional:  Positive for appetite change. Negative for activity change, chills, fatigue and fever.  HENT: Negative.    Eyes: Negative.   Respiratory: Negative.    Cardiovascular: Negative.   Gastrointestinal:  Positive for abdominal pain and nausea. Negative for constipation, diarrhea and vomiting.  Genitourinary:  Positive for frequency. Negative for decreased urine volume, dysuria, urgency, vaginal bleeding, vaginal discharge and vaginal pain.  Musculoskeletal: Negative.   Neurological: Negative.    Physical Exam Updated Vital Signs BP 133/82 (BP Location: Left Arm)   Pulse 84   Temp 98.2 F (36.8 C) (Oral)   Resp 15   Ht 5\' 1"  (1.549 m)   Wt 91.6 kg   LMP 08/01/2021   SpO2 100%   BMI 38.17 kg/m   Physical Exam Vitals and nursing note reviewed.  Constitutional:      Appearance: She is obese. She is not ill-appearing or toxic-appearing.  HENT:     Head: Normocephalic and atraumatic.     Nose: Nose normal.      Mouth/Throat:     Mouth: Mucous membranes are moist.     Pharynx: Oropharynx is clear. Uvula midline. No oropharyngeal exudate or posterior oropharyngeal erythema.     Tonsils: No tonsillar exudate.  Eyes:     General: Lids are normal. Vision grossly intact.        Right eye: No discharge.        Left eye: No discharge.     Extraocular Movements: Extraocular movements intact.     Conjunctiva/sclera: Conjunctivae normal.     Pupils: Pupils are equal, round, and reactive to light.  Neck:     Trachea: Trachea and phonation normal.  Cardiovascular:     Rate and Rhythm: Normal rate and regular rhythm.     Pulses: Normal pulses.     Heart sounds: Normal heart sounds.  No murmur heard. Pulmonary:     Effort: Pulmonary effort is normal. No tachypnea, bradypnea, accessory muscle usage, prolonged expiration or respiratory distress.     Breath sounds: Normal breath sounds. No wheezing or rales.  Chest:     Chest wall: No mass, lacerations, deformity, swelling, tenderness, crepitus or edema.  Abdominal:     General: Bowel sounds are normal. There is no distension.     Palpations: Abdomen is soft.     Tenderness: There is abdominal tenderness in the right upper quadrant, right lower quadrant and epigastric area. There is no right CVA tenderness, left CVA tenderness, guarding or rebound. Positive signs include Murphy's sign, Rovsing's sign and McBurney's sign. Negative signs include obturator sign.  Musculoskeletal:        General: No deformity.     Cervical back: Normal range of motion and neck supple. No edema, rigidity or crepitus. No pain with movement or muscular tenderness.     Right lower leg: No edema.     Left lower leg: No edema.  Lymphadenopathy:     Cervical: No cervical adenopathy.  Skin:    General: Skin is warm and dry.     Capillary Refill: Capillary refill takes less than 2 seconds.  Neurological:     General: No focal deficit present.     Mental Status: She is alert. Mental  status is at baseline.     Gait: Gait is intact. Gait normal.  Psychiatric:        Mood and Affect: Mood normal.    ED Results / Procedures / Treatments   Labs (all labs ordered are listed, but only abnormal results are displayed) Labs Reviewed  COMPREHENSIVE METABOLIC PANEL - Abnormal; Notable for the following components:      Result Value   AST 14 (*)    Total Bilirubin 0.2 (*)    All other components within normal limits  CBC WITH DIFFERENTIAL/PLATELET - Abnormal; Notable for the following components:   Hemoglobin 11.6 (*)    All other components within normal limits  URINALYSIS, ROUTINE W REFLEX MICROSCOPIC - Abnormal; Notable for the following components:   APPearance CLOUDY (*)    Hgb urine dipstick LARGE (*)    All other components within normal limits  URINALYSIS, MICROSCOPIC (REFLEX) - Abnormal; Notable for the following components:   Bacteria, UA RARE (*)    All other components within normal limits  LIPASE, BLOOD  PREGNANCY, URINE    EKG EKG Interpretation  Date/Time:  Saturday August 01 2021 19:54:39 EDT Ventricular Rate:  79 PR Interval:  157 QRS Duration: 99 QT Interval:  395 QTC Calculation: 453 R Axis:   69 Text Interpretation: Sinus rhythm No significant change since last tracing Confirmed by Gwyneth Sprout ([id number]) on 08/01/2021 7:57:05 PM  Radiology CT Abdomen Pelvis W Contrast  Result Date: 08/01/2021 CLINICAL DATA:  Right lower quadrant pain EXAM: CT ABDOMEN AND PELVIS WITH CONTRAST TECHNIQUE: Multidetector CT imaging of the abdomen and pelvis was performed using the standard protocol following bolus administration of intravenous contrast. CONTRAST:  OMNIPAQUE IOHEXOL 300 MG/ML  SOLN COMPARISON:  None. FINDINGS: Lower chest: Lung bases are clear. No effusions. Heart is normal size. Hepatobiliary: No focal hepatic abnormality. Gallstones seen within the gallbladder. No CT evidence of acute cholecystitis. Pancreas: No focal abnormality or  ductal dilatation. Spleen: No focal abnormality.  Normal size. Adrenals/Urinary Tract: No adrenal abnormality. No focal renal abnormality. No stones or hydronephrosis. Urinary bladder is unremarkable. Stomach/Bowel: Normal appendix. Stomach, large  and small bowel grossly unremarkable. Vascular/Lymphatic: No evidence of aneurysm or adenopathy. Reproductive: Uterus and adnexa unremarkable.  No mass. Other: No free fluid or free air. Musculoskeletal: No acute bony abnormality. IMPRESSION: Normal appendix. Cholelithiasis. No acute findings. Electronically Signed   By: Charlett Nose M.D.   On: 08/01/2021 22:15   US Abdomen Limited RUQ (LIVER/GB)  Result Date: 08/01/2021 CLINICAL DATA:  Right upper quadrant pain EXAM: ULTRASOUND ABDOMEN LIMITED RIGHT UPPER QUADRANT COMPARISON:  04/15/2021 FINDINGS: Gallbladder: Cholelithiasis is noted. No wall thickening or pericholecystic fluid is noted. Negative sonographic Murphy's sign is noted. Common bile duct: Diameter: 3.3 mm. Liver: No focal lesion identified. Within normal limits in parenchymal echogenicity. Portal vein is patent on color Doppler imaging with normal direction of blood flow towards the liver. Other: None. IMPRESSION: Cholelithiasis without complicating factors. Electronically Signed   By: Alcide Clever M.D.   On: 08/01/2021 21:08    Procedures Procedures   Medications Ordered in ED Medications  ondansetron (ZOFRAN) injection 4 mg (4 mg Intravenous Given 08/01/21 2121)  morphine 4 MG/ML injection 4 mg (4 mg Intravenous Given 08/01/21 2122)  iohexol (OMNIPAQUE) 300 MG/ML solution 100 mL (100 mLs Intravenous Contrast Given 08/01/21 2156)  diphenhydrAMINE (BENADRYL) injection 12.5 mg (12.5 mg Intravenous Given 08/01/21 2223)  ketorolac (TORADOL) 30 MG/ML injection 30 mg (30 mg Intravenous Given 08/01/21 2333)  oxyCODONE-acetaminophen (PERCOCET/ROXICET) 5-325 MG per tablet 1 tablet (1 tablet Oral Given 08/01/21 2333)    ED Course  I have reviewed  the triage vital signs and the nursing notes.  Pertinent labs & imaging results that were available during my care of the patient were reviewed by me and considered in my medical decision making (see chart for details).    MDM Rules/Calculators/A&P                         25 year old female with history of cholelithiasis who presents with worsening right upper belly pain now right lower quadrant as well.  The differential diagnosis for RUQ includes but is not limited to:  Cholelithiasis / choledocholithiasis / cholecystitis / cholangitis, hepatitis (eg. viral, alcoholic, toxic),liver abscess, pancreatitis, liver / pancreatic / biliary tract cancer, ischemic hepatopathy (shock liver), hepatic vein obstruction (Budd-Chiari syndrome), liver cell adenoma, peptic ulcer disease (duodenal), functional or nonulcer dyspepsia, right lower lobe pneumonia, pyelonephritis, urinary calculi,  Fitz-Hugh-Curtis syndrome (with pelvic inflammatory disease), herpes zoster, trauma or musculoskeletal pain, herniated disk, abdominal abscess, intestinal ischemia, physical or sexual abuse, ectopic pregnancy, IUP, Mittelschmerz, ovarian cyst/torsion, threatened/ievitable abortion, PID, endometriosis, molar pregnancy, heterotopic pregnancy, corpus luteum cyst, appendicitis, UTI/renal colic, IBD.   Vital signs are normal on intake.  Cardiopulmonary exam is normal, abdominal exam with positive Murphy sign, positive McBurney's point tenderness, and positive Rovsing sign.  Patient is neurovascular intact in all 4 extremities.  Pregnancy test is negative.  Labs pending at this time.  Right upper quadrant ultrasound is negative for acute cholecystitis but does reveal cholelithiasis without complicating factors.  EKG is normal.  Pending laboratory studies we will proceed with CT of the abdomen pelvis with contrast.  CBC unremarkable, CMP unremarkable with normal renal and liver function.  UA without signs of infection.  Right upper  quadrant ultrasound with cholelithiasis without acute complicating factor and CT abdomen pelvis without acute intra abdominal or pelvic abnormality.  Patient's pain improved with IV medications.  Will offer dose of oral medication prior to discharge.  No further work-up warranted in the ED as time.  Patient symptoms appear to be secondary to her biliary colic.  Will provide information for outpatient general surgery follow-up. Kyli voiced understanding for medical evaluation and treatment.  Each of her questions was answered to her expressed satisfaction.  Return precautions were given.  Patient is well-appearing, stable, appropriate for discharge at this time.  This chart was dictated using voice recognition software, Dragon. Despite the best efforts of this provider to proofread and correct errors, errors may still occur which can change documentation meaning.    Final Clinical Impression(s) / ED Diagnoses Final diagnoses:  RUQ pain    Rx / DC Orders ED Discharge Orders          Ordered    oxyCODONE-acetaminophen (PERCOCET/ROXICET) 5-325 MG tablet  Every 6 hours PRN        08/01/21 2321             Brookelle Pellicane, Eugene Gavia, PA-C 08/02/21 0106    Gwyneth Sprout, MD 08/02/21 1625

## 2021-08-01 NOTE — ED Notes (Signed)
Patient transported to [facility] 

## 2021-08-01 NOTE — ED Triage Notes (Signed)
Reports she was diagnosed with gallstones several months ago.  Right abdominal pain upper and lower.  Reports it radiates into the right shoulder and arm.  Taking ibuprofen for pain with little relief.

## 2021-08-21 ENCOUNTER — Other Ambulatory Visit: Payer: Self-pay

## 2021-08-21 ENCOUNTER — Ambulatory Visit (HOSPITAL_COMMUNITY)
Admission: EM | Admit: 2021-08-21 | Discharge: 2021-08-21 | Disposition: A | Discharge status: Home / Self Care | Payer: Medicaid Other | Attending: Emergency Medicine | Admitting: Emergency Medicine

## 2021-08-21 ENCOUNTER — Encounter (HOSPITAL_COMMUNITY): Payer: Self-pay

## 2021-08-21 DIAGNOSIS — K029 Dental caries, unspecified: Secondary | ICD-10-CM

## 2021-08-21 DIAGNOSIS — K047 Periapical abscess without sinus: Secondary | ICD-10-CM

## 2021-08-21 MED ORDER — IBUPROFEN 600 MG PO TABS: 600.0000 mg | ORAL_TABLET | Freq: Four times a day (QID) | ORAL | 0 refills | Status: DC | PRN

## 2021-08-21 MED ORDER — HYDROCODONE-ACETAMINOPHEN 5-325 MG PO TABS: 1.0000 | ORAL_TABLET | Freq: Four times a day (QID) | ORAL | 0 refills | Status: DC | PRN

## 2021-08-21 MED ORDER — AMOXICILLIN-POT CLAVULANATE 875-125 MG PO TABS: 1.0000 | ORAL_TABLET | Freq: Two times a day (BID) | ORAL | 0 refills | Status: AC

## 2021-08-21 MED ORDER — LIDOCAINE VISCOUS HCL 2 % MT SOLN: 10.0000 mL | Freq: Four times a day (QID) | OROMUCOSAL | 0 refills | Status: DC | PRN

## 2021-08-21 NOTE — ED Provider Notes (Signed)
HPI  SUBJECTIVE:  Cheryl Bowen is a 25 y.o. female who presents with 1 month of intermittent right lower dental pain that radiates into her ear.  Its started up again 5 days ago.  It lasts hours.  She states that she has a bad tooth in this area.  Reports right-sided facial swelling.  No fevers, trismus, trauma to the teeth, gingival swelling, drainage, change in hearing, otorrhea.  She has been taking ibuprofen 800 mg every 8 hours, doing salt water gargles, Orajel and Orajel mouthwash.  The ibuprofen helps.  Symptoms are worse with lying down, exposure to air and eating and drinking.  She has no significant past medical history.  LMP: started Last night.  Denies possibility being pregnant.  PMD: [facility].  Dentist: None.   Past Medical History:  Diagnosis Date   Asthma    Migraines    Obesity     History reviewed. No pertinent surgical history.  Family History  Problem Relation Age of Onset   Asthma Mother     Social History   Tobacco Use   Smoking status: Some Days   Smokeless tobacco: Never  Substance Use Topics   Alcohol use: No   Drug use: No    No current facility-administered medications for this encounter.  Current Outpatient Medications:    amoxicillin-clavulanate (AUGMENTIN) 875-125 MG tablet, Take 1 tablet by mouth 2 (two) times daily for 7 days., Disp: 14 tablet, Rfl: 0   HYDROcodone-acetaminophen (NORCO/VICODIN) 5-325 MG tablet, Take 1-2 tablets by mouth every 6 (six) hours as needed for moderate pain or severe pain., Disp: 12 tablet, Rfl: 0   ibuprofen (ADVIL) 600 MG tablet, Take 1 tablet (600 mg total) by mouth every 6 (six) hours as needed., Disp: 30 tablet, Rfl: 0   lidocaine (XYLOCAINE) 2 % solution, Use as directed 10 mLs in the mouth or throat every 6 (six) hours as needed for mouth pain. Hold in mouth and spit. Do not swallow., Disp: 100 mL, Rfl: 0   beclomethasone (QVAR REDIHALER) 80 MCG/ACT inhaler, Inhale 1 puff into the lungs 2 (two) times  daily. (Patient taking differently: Inhale 1 puff into the lungs 2 (two) times daily as needed (shortness of breath). ), Disp: 10.6 g, Rfl: 1   budesonide (PULMICORT FLEXHALER) 180 MCG/ACT inhaler, Inhale 2 puffs into the lungs 2 (two) times daily. (Patient taking differently: Inhale 2 puffs into the lungs 2 (two) times daily as needed (shortness of breath). ), Disp: 1 each, Rfl: 2   FLOVENT HFA 110 MCG/ACT inhaler, TAKE 2 PUFFS BY MOUTH TWICE A DAY (Patient taking differently: Inhale 2 puffs into the lungs 2 (two) times daily as needed (shortness of breath). ), Disp: 12 Inhaler, Rfl: 3   fluticasone (FLONASE) 50 MCG/ACT nasal spray, Place 2 sprays into both nostrils daily. During your bad season (Patient taking differently: Place 2 sprays into both nostrils daily as needed for allergies. ), Disp: 16 g, Rfl: 11   montelukast (SINGULAIR) 10 MG tablet, TAKE 1 TABLET BY MOUTH EVERYDAY AT BEDTIME (Patient taking differently: Take 10 mg by mouth at bedtime. ), Disp: 30 tablet, Rfl: 1   PROAIR HFA 108 (90 Base) MCG/ACT inhaler, Inhale 2 puffs into the lungs every 6 (six) hours as needed for wheezing or shortness of breath. ., Disp: 18 g, Rfl: 1  Allergies  Allergen Reactions   Other Anaphylaxis    Bee    Doxycycline Nausea And Vomiting   Penicillins Nausea And Vomiting   Pollen  Extract Other (See Comments)    Asthma      ROS  As noted in HPI.   Physical Exam  BP 127/80 (BP Location: Left Arm)   Pulse 93   Temp 98.6 F (37 C) (Oral)   Resp 18   LMP 08/20/2021 Comment: neg preg test  SpO2 98%   Constitutional: Well developed, well nourished,, moderate painful distress.  Crying. Eyes:  EOMI, conjunctiva normal bilaterally HENT: Normocephalic, atraumatic,mucus membranes moist.  Tooth #32 right third molar decayed, tender to palpation with surrounding gingival swelling.  Mild right facial swelling.  EAC, TMs erythematous bilaterally.  No TM dullness or bulging.  No drooling, trismus, neck  stiffness. Neck: Positive right-sided cervical lymphadenopathy. Respiratory: Normal inspiratory effort Cardiovascular: Normal rate GI: nondistended skin: No rash, skin intact Musculoskeletal: no deformities Neurologic: Alert & oriented x 3, no focal neuro deficits Psychiatric: Speech and behavior appropriate   ED Course   Medications - No data to display  No orders of the defined types were placed in this encounter.   No results found for this or any previous visit (from the past 24 hour(s)). No results found.  ED Clinical Impression  1. Dental infection   2. Pain due to dental caries      ED Assessment/Plan  [place] Narcotic database reviewed for this patient, and feel that the risk/benefit ratio today is favorable for proceeding with a prescription for controlled substance.  Prescribed 10 Percocet on 10/15.  No other opiate prescriptions in the past 2 years  Home with Augmentin.  Patient denies anaphylaxis or severe allergic reaction with penicillins, reports nausea and vomiting only.  Ibuprofen with a Tylenol containing product.  Short course of Norco.  Viscous lidocaine, salt water or Listerine rinses.  Dental list provided.  Discussed , MDM, treatment plan, and plan for follow-up with patient. Discussed sn/sx that should prompt return to the ED. patient agrees with plan.   Meds ordered this encounter  Medications   lidocaine (XYLOCAINE) 2 % solution    Sig: Use as directed 10 mLs in the mouth or throat every 6 (six) hours as needed for mouth pain. Hold in mouth and spit. Do not swallow.    Dispense:  100 mL    Refill:  0   HYDROcodone-acetaminophen (NORCO/VICODIN) 5-325 MG tablet    Sig: Take 1-2 tablets by mouth every 6 (six) hours as needed for moderate pain or severe pain.    Dispense:  12 tablet    Refill:  0   amoxicillin-clavulanate (AUGMENTIN) 875-125 MG tablet    Sig: Take 1 tablet by mouth 2 (two) times daily for 7 days.    Dispense:  14 tablet    Refill:  0    ibuprofen (ADVIL) 600 MG tablet    Sig: Take 1 tablet (600 mg total) by mouth every 6 (six) hours as needed.    Dispense:  30 tablet    Refill:  0      *This clinic note was created using Scientist, clinical (histocompatibility and immunogenetics). Therefore, there may be occasional mistakes despite careful proofreading.  ?    Domenick Gong, MD 08/23/21 [phone]

## 2021-08-21 NOTE — Discharge Instructions (Addendum)
Take 600 mg of ibuprofen with a Tylenol containing product 3-4 times a day as needed for pain.  Either take 1000 mg of Tylenol with the ibuprofen or 1-2 Norco with the ibuprofen for severe pain.  Do not take Norco and Tylenol as they both have Tylenol in them and too much Tylenol can hurt your liver.  Do not exceed 4000 mg of Tylenol from all sources in 24 hours.  Listerine, saline rinses, finish the Augmentin unless you have an allergic reaction.  Make sure you take the Augmentin with food.  Look up the [facility] [employer]'s [facility] for free dental clinics. https://www.williams-garcia.biz/.asp  Get there early and be prepared to wait. Caralyn Guile and [employer] have Armed forces operational officer schools that provide low cost routine dental care.   Other resources: [facility] [address] [place], [place] [phone]  Patients with Medicaid: [facility] [phone] [address]                                [phone] [employer] [address] Phone:  [phone]                                                  Phone:  [phone]  Dr. Renee Rival [address] [phone]    [facility] [phone] extension [phone] [address]   [place] [phone] [address] [place].  Rescue mission [phone] extension [phone] [place], [place], [address] First come first serve for the first 10 clients.  May do simple extractions only, no wisdom teeth or surgery.  You may try the second for Thursday of the month starting at 6:30 AM.  [facility] You may call the school to see if they are still helping to provide dental care for emergent cases.  If unable to pay or uninsured, contact:  [facility] or [facility]. to become qualified for the adult dental clinic.  No matter what dental problem you have, it will not get better unless you get good dental care.  If the  tooth is not taken care of, your symptoms will come back in time and you will be visiting [place] again in the Urgent Care Center with a bad toothache.  So, see your dentist as soon as possible.  If you don't have a dentist, we can give you a list of dentists.  Sometimes the most cost effective treatment is removal of the tooth.  This can be done very inexpensively through one of the low cost Runner, broadcasting/film/video such as the facility on [facility] in [place] [phone]).  The downside to this is that you will have one less tooth and this can effect your ability to chew.  Some other things that can be done for a dental infection include the following:  Rinse your mouth out with hot salt water (1/2 tsp of table salt and a pinch of baking soda in 8 oz of hot water).  You can do this every 2 or 3 hours. Avoid cold foods, beverages, and cold air.  This will make your symptoms worse.  Sleep with your head elevated.  Sleeping flat will cause your gums and oral tissues to swell and make them hurt more.  You can sleep on several pillows.  Even better is to sleep in a recliner with your head higher than your heart. For mild to moderate pain, you can take Tylenol, ibuprofen, or Aleve. External application of heat by a heating pad, hot water bottle, or hot wet towel can help with pain and speed healing.  You can do this every 2 to 3 hours. Do not fall asleep on a heating pad since this can cause a burn.   Go to www.goodrx.com to look up your medications. This will give you a list of where you can find your prescriptions at the most affordable prices. Or ask the pharmacist what the cash price is, or if they have any other discount programs available to help make your medication more affordable. This can be less expensive than what you would pay with insurance.

## 2022-01-16 ENCOUNTER — Other Ambulatory Visit: Payer: Self-pay

## 2022-01-16 ENCOUNTER — Emergency Department (HOSPITAL_BASED_OUTPATIENT_CLINIC_OR_DEPARTMENT_OTHER)
Admission: EM | Admit: 2022-01-16 | Discharge: 2022-01-16 | Disposition: A | Discharge status: Home / Self Care | Payer: 59 | Attending: Emergency Medicine | Admitting: Emergency Medicine

## 2022-01-16 ENCOUNTER — Encounter (HOSPITAL_BASED_OUTPATIENT_CLINIC_OR_DEPARTMENT_OTHER): Payer: Self-pay | Admitting: *Deleted

## 2022-01-16 DIAGNOSIS — K0889 Other specified disorders of teeth and supporting structures: Secondary | ICD-10-CM | POA: Diagnosis present

## 2022-01-16 DIAGNOSIS — K047 Periapical abscess without sinus: Secondary | ICD-10-CM | POA: Insufficient documentation

## 2022-01-16 MED ORDER — CLINDAMYCIN HCL 300 MG PO CAPS: 300.0000 mg | ORAL_CAPSULE | Freq: Three times a day (TID) | ORAL | 0 refills | Status: AC

## 2022-01-16 MED ORDER — OXYCODONE-ACETAMINOPHEN 5-325 MG PO TABS: 1.0000 | ORAL_TABLET | Freq: Four times a day (QID) | ORAL | 0 refills | Status: DC | PRN

## 2022-01-16 MED ORDER — OXYCODONE-ACETAMINOPHEN 5-325 MG PO TABS
2.0000 | ORAL_TABLET | Freq: Once | ORAL | Status: AC
  Administered 2022-01-16: 2 via ORAL
  Filled 2022-01-16: qty 2

## 2022-01-16 NOTE — ED Triage Notes (Signed)
Pt is here with pain from cracked left upper tooth.  Pt reports pain radiating to left ear and left side of neck.   ?

## 2022-01-16 NOTE — Discharge Instructions (Signed)
Call your primary care doctor or specialist as discussed in the next 2-3 days.   Return immediately back to the ER if:  Your symptoms worsen within the next 12-24 hours. You develop new symptoms such as new fevers, persistent vomiting, new pain, shortness of breath, or new weakness or numbness, or if you have any other concerns.  

## 2022-01-16 NOTE — ED Provider Notes (Signed)
?[facility] EMERGENCY DEPT ?Provider Note ? ? ?CSN: [id number] ?Arrival date & time: 01/16/22  1939 ? ?  ? ?History ? ?Chief Complaint  ?Patient presents with  ? Dental Pain  ? ? ?Cheryl Bowen is a 26 y.o. female. ? ?Patient presents with dental pain.  Describes sharp ache in the left upper tooth.  She has a crack in the tooth and has been trying to see a dentist.  Denies any fevers or cough or vomiting or diarrhea taking ibuprofen without relief of pain. ? ? ?  ? ?Home Medications ?Prior to Admission medications   ?Medication Sig Start Date End Date Taking? Authorizing Provider  ?clindamycin (CLEOCIN) 300 MG capsule Take 1 capsule (300 mg total) by mouth 3 (three) times daily for 10 days. 01/16/22 01/26/22 Yes Cheryll Cockayne, MD  ?oxyCODONE-acetaminophen (PERCOCET/ROXICET) 5-325 MG tablet Take 1 tablet by mouth every 6 (six) hours as needed for severe pain. 01/16/22  Yes Cheryll Cockayne, MD  ?beclomethasone (QVAR REDIHALER) 80 MCG/ACT inhaler Inhale 1 puff into the lungs 2 (two) times daily. ?Patient taking differently: Inhale 1 puff into the lungs 2 (two) times daily as needed (shortness of breath).  12/16/17   Diallo, Lilia Argue, MD  ?budesonide (PULMICORT FLEXHALER) 180 MCG/ACT inhaler Inhale 2 puffs into the lungs 2 (two) times daily. ?Patient taking differently: Inhale 2 puffs into the lungs 2 (two) times daily as needed (shortness of breath).  12/21/17   Diallo, Lilia Argue, MD  ?FLOVENT HFA 110 MCG/ACT inhaler TAKE 2 PUFFS BY MOUTH TWICE A DAY ?Patient taking differently: Inhale 2 puffs into the lungs 2 (two) times daily as needed (shortness of breath).  07/03/18   Diallo, Lilia Argue, MD  ?fluticasone (FLONASE) 50 MCG/ACT nasal spray Place 2 sprays into both nostrils daily. During your bad season ?Patient taking differently: Place 2 sprays into both nostrils daily as needed for allergies.  12/29/16   Rumley, Lora Havens, DO  ?ibuprofen (ADVIL) 600 MG tablet Take 1 tablet (600 mg total) by mouth every 6 (six) hours  as needed. 08/21/21   Domenick Gong, MD  ?lidocaine (XYLOCAINE) 2 % solution Use as directed 10 mLs in the mouth or throat every 6 (six) hours as needed for mouth pain. Hold in mouth and spit. Do not swallow. 08/21/21   Domenick Gong, MD  ?montelukast (SINGULAIR) 10 MG tablet TAKE 1 TABLET BY MOUTH EVERYDAY AT BEDTIME ?Patient taking differently: Take 10 mg by mouth at bedtime.  05/22/18   Diallo, Lilia Argue, MD  ?PROAIR HFA 108 (90 Base) MCG/ACT inhaler Inhale 2 puffs into the lungs every 6 (six) hours as needed for wheezing or shortness of breath. . 08/31/18   Diallo, Lilia Argue, MD  ?albuterol (PROVENTIL) 90 MCG/ACT inhaler Inhale 2 puffs into the lungs every 4 (four) hours as needed. Using inhaler   09/06/11  [provider]  ?   ? ?Allergies    ?Other, Doxycycline, Penicillins, and Pollen extract   ? ?Review of Systems   ?Review of Systems  ?Constitutional:  Negative for fever.  ?HENT:  Negative for ear pain.   ?Eyes:  Negative for pain.  ?Respiratory:  Negative for cough.   ?Cardiovascular:  Negative for chest pain.  ?Gastrointestinal:  Negative for abdominal pain.  ?Genitourinary:  Negative for flank pain.  ?Musculoskeletal:  Negative for back pain.  ?Skin:  Negative for rash.  ?Neurological:  Negative for headaches.  ? ?Physical Exam ?Updated Vital Signs ?BP (!) 133/94 (BP Location: Right Arm)   Pulse 79  Temp 98.4 ?F (36.9 ?C) (Oral)   Resp 20   Wt 90 kg   LMP 01/14/2022 (Exact Date)   SpO2 100%   BMI 37.49 kg/m?  ?Physical Exam ?Constitutional:   ?   General: She is not in acute distress. ?   Appearance: Normal appearance.  ?HENT:  ?   Head: Normocephalic.  ?   Nose: Nose normal.  ?   Mouth/Throat:  ?   Comments: Left upper premolar has a large crack and tenderness palpation at the region.  No gumline swelling or abscess noted. ?Eyes:  ?   Extraocular Movements: Extraocular movements intact.  ?Cardiovascular:  ?   Rate and Rhythm: Normal rate.  ?Pulmonary:  ?   Effort: Pulmonary effort  is normal.  ?Musculoskeletal:     ?   General: Normal range of motion.  ?   Cervical back: Normal range of motion.  ?Neurological:  ?   General: No focal deficit present.  ?   Mental Status: She is alert. Mental status is at baseline.  ? ? ?ED Results / Procedures / Treatments   ?Labs ?(all labs ordered are listed, but only abnormal results are displayed) ?Labs Reviewed - No data to display ? ?EKG ?None ? ?Radiology ?No results found. ? ?Procedures ?Procedures  ? ? ?Medications Ordered in ED ?Medications  ?oxyCODONE-acetaminophen (PERCOCET/ROXICET) 5-325 MG per tablet 2 tablet (has no administration in time range)  ? ? ?ED Course/ Medical Decision Making/ A&P ?  ?                        ?Medical Decision Making ?Risk ?Prescription drug management. ? ? ?Patient given Percocet here with improvement of symptoms.  Given a prescription clindamycin.  Advise follow-up with dentistry within the week.  Advised return if she has fevers worsening symptoms or any additional concerns. ? ? ? ? ? ? ? ?Final Clinical Impression(s) / ED Diagnoses ?Final diagnoses:  ?Dental infection  ? ? ?Rx / DC Orders ?ED Discharge Orders   ? ?      Ordered  ?  clindamycin (CLEOCIN) 300 MG capsule  3 times daily       ? 01/16/22 2149  ?  oxyCODONE-acetaminophen (PERCOCET/ROXICET) 5-325 MG tablet  Every 6 hours PRN       ? 01/16/22 2149  ? ?  ?  ? ?  ? ? ?  ?Cheryll Cockayne, MD ?01/16/22 2153 ? ?

## 2022-01-17 ENCOUNTER — Telehealth (HOSPITAL_BASED_OUTPATIENT_CLINIC_OR_DEPARTMENT_OTHER): Payer: Self-pay | Admitting: Emergency Medicine

## 2022-01-17 MED ORDER — OXYCODONE-ACETAMINOPHEN 5-325 MG PO TABS: 1.0000 | ORAL_TABLET | Freq: Four times a day (QID) | ORAL | 0 refills | Status: DC | PRN

## 2022-01-17 NOTE — Telephone Encounter (Signed)
[facility] does not have [facility] or [facility]cod[facility].  Cancelled the prior prescription there and resent it to [facility] on [address] after verifying that they do have it in stock. ?

## 2022-05-10 ENCOUNTER — Other Ambulatory Visit: Payer: Self-pay

## 2022-05-10 ENCOUNTER — Encounter (HOSPITAL_BASED_OUTPATIENT_CLINIC_OR_DEPARTMENT_OTHER): Payer: Self-pay

## 2022-05-10 ENCOUNTER — Emergency Department (HOSPITAL_BASED_OUTPATIENT_CLINIC_OR_DEPARTMENT_OTHER)
Admission: EM | Admit: 2022-05-10 | Discharge: 2022-05-10 | Disposition: A | Discharge status: Home / Self Care | Payer: Medicaid Other | Attending: Emergency Medicine | Admitting: Emergency Medicine

## 2022-05-10 ENCOUNTER — Emergency Department (HOSPITAL_BASED_OUTPATIENT_CLINIC_OR_DEPARTMENT_OTHER): Payer: Medicaid Other

## 2022-05-10 DIAGNOSIS — K8000 Calculus of gallbladder with acute cholecystitis without obstruction: Secondary | ICD-10-CM | POA: Insufficient documentation

## 2022-05-10 LAB — COMPREHENSIVE METABOLIC PANEL
ALT: 12 U/L (ref 0–44)
AST: 12 U/L — ABNORMAL LOW (ref 15–41)
Albumin: 4.5 g/dL (ref 3.5–5.0)
Alkaline Phosphatase: 40 U/L (ref 38–126)
Anion gap: 10 (ref 5–15)
BUN: 12 mg/dL (ref 6–20)
CO2: 23 mmol/L (ref 22–32)
Calcium: 10.2 mg/dL (ref 8.9–10.3)
Chloride: 104 mmol/L (ref 98–111)
Creatinine, Ser: 0.67 mg/dL (ref 0.44–1.00)
GFR, Estimated: >60 mL/min (ref >60)
Glucose, Bld: 91 mg/dL (ref 70–99)
Potassium: 4.1 mmol/L (ref 3.5–5.1)
Sodium: 137 mmol/L (ref 135–145)
Total Bilirubin: 0.4 mg/dL (ref 0.3–1.2)
Total Protein: 8.1 g/dL (ref 6.5–8.1)

## 2022-05-10 LAB — URINALYSIS, ROUTINE W REFLEX MICROSCOPIC
Bilirubin Urine: NEGATIVE
Glucose, UA: NEGATIVE mg/dL
Hgb urine dipstick: NEGATIVE
Ketones, ur: NEGATIVE mg/dL
Leukocytes,Ua: NEGATIVE
Nitrite: NEGATIVE
Protein, ur: NEGATIVE mg/dL
Specific Gravity, Urine: 1.023 (ref 1.005–1.030)
pH: 6 (ref 5.0–8.0)

## 2022-05-10 LAB — LIPASE, BLOOD: Lipase: 28 U/L (ref 11–51)

## 2022-05-10 LAB — CBC
HCT: 38.7 % (ref 36.0–46.0)
Hemoglobin: 12.6 g/dL (ref 12.0–15.0)
MCH: 25.7 pg — ABNORMAL LOW (ref 26.0–34.0)
MCHC: 32.6 g/dL (ref 30.0–36.0)
MCV: 79 fL — ABNORMAL LOW (ref 80.0–100.0)
Platelets: 342 10*3/uL (ref 150–400)
RBC: 4.9 MIL/uL (ref 3.87–5.11)
RDW: 16 % — ABNORMAL HIGH (ref 11.5–15.5)
WBC: 5 10*3/uL (ref 4.0–10.5)
nRBC: 0 % (ref 0.0–0.2)

## 2022-05-10 LAB — PREGNANCY, URINE: Preg Test, Ur: NEGATIVE

## 2022-05-10 MED ORDER — MORPHINE SULFATE (PF) 4 MG/ML IV SOLN
4.0000 mg | Freq: Once | INTRAVENOUS | Status: AC
  Administered 2022-05-10: 4 mg via INTRAVENOUS
  Filled 2022-05-10: qty 1

## 2022-05-10 MED ORDER — PANTOPRAZOLE SODIUM 40 MG IV SOLR
40.0000 mg | Freq: Once | INTRAVENOUS | Status: AC
  Administered 2022-05-10: 40 mg via INTRAVENOUS
  Filled 2022-05-10: qty 10

## 2022-05-10 MED ORDER — ONDANSETRON HCL 4 MG/2ML IJ SOLN
4.0000 mg | Freq: Once | INTRAMUSCULAR | Status: AC
  Administered 2022-05-10: 4 mg via INTRAVENOUS
  Filled 2022-05-10: qty 2

## 2022-05-10 MED ORDER — HYDROCODONE-ACETAMINOPHEN 5-325 MG PO TABS: 1.0000 | ORAL_TABLET | ORAL | 0 refills | Status: DC | PRN

## 2022-05-10 MED ORDER — ONDANSETRON 4 MG PO TBDP: ORAL_TABLET | ORAL | 0 refills | Status: DC

## 2022-05-10 MED ORDER — SODIUM CHLORIDE 0.9 % IV BOLUS
1000.0000 mL | Freq: Once | INTRAVENOUS | Status: AC
  Administered 2022-05-10: 1000 mL via INTRAVENOUS

## 2022-05-10 NOTE — ED Notes (Signed)
D/c paperwork reviewed with pt, including prescription. No questions or concerns at time of d/c. Ambulatory to ED exit without assistance.  

## 2022-05-10 NOTE — Discharge Instructions (Addendum)
Call make an appointment to follow-up with the surgery group listed above.  Return to emergency room if you have any worsening symptoms including increased pain, fevers, vomiting or other worsening symptoms.

## 2022-05-10 NOTE — ED Triage Notes (Signed)
Patient here POV from Home.  Endorses being seen in ED in October for Flank Pain and was diagnosed with Possible Gall Stones. Patient was discharged with pain Medication and since then the Symptoms have been Intermittent. They have been more severe over the past two weeks however.  Pain is Right Flank and radiates to Right Upper Back. Also endorses Moderate Nausea. Mild Emesis at Times. No Known Fevers. Chills. No Changes in BM. Mild Dysuria at Times.   NAD Noted during Triage. A&Ox4. GCS 15. Ambulatory.

## 2022-05-10 NOTE — ED Notes (Signed)
Pt provided apple juice for PO challenge, per EDP Belfi request. Will continue to monitor.

## 2022-05-10 NOTE — ED Notes (Signed)
Patient stated that she is itchy all over her upper torso RN notified

## 2022-05-10 NOTE — ED Notes (Signed)
US at bedside

## 2022-05-10 NOTE — ED Notes (Signed)
Family at bedside. 

## 2022-05-10 NOTE — ED Provider Notes (Signed)
[facility] EMERGENCY DEPT Provider Note   CSN: [id number] Arrival date & time: 05/10/22  1614     History  Chief Complaint  Patient presents with   Flank Pain    Cheryl Bowen is a 26 y.o. female.  Patient is a 26 year old female who presents with right upper quadrant abdominal pain.  She has a history of known gallstones.  She was seen in the ED in March 26, 2021 and then October 2022 with cholelithiasis.  She has not yet followed up with the surgeon.  She is had some ongoing pain in her right upper abdomen which is gotten worse over the last week and then markedly worse over the last couple days.  She had associated nausea and nonbloody, nonbilious emesis.  No change in her stools.  She has sometimes some burning when she pees.  No fevers.  Pain is not really related to eating at this point.       Home Medications Prior to Admission medications   Medication Sig Start Date End Date Taking? Authorizing Provider  HYDROcodone-acetaminophen (NORCO/VICODIN) 5-325 MG tablet Take 1-2 tablets by mouth every 4 (four) hours as needed. 05/10/22  Yes Rolan Bucco, MD  ondansetron (ZOFRAN-ODT) 4 MG disintegrating tablet 4mg  ODT q4 hours prn nausea/vomit 05/10/22  Yes 05/12/22, MD  beclomethasone (QVAR REDIHALER) 80 MCG/ACT inhaler Inhale 1 puff into the lungs 2 (two) times daily. Patient taking differently: Inhale 1 puff into the lungs 2 (two) times daily as needed (shortness of breath).  12/16/17   Diallo, 02/15/18, MD  budesonide (PULMICORT FLEXHALER) 180 MCG/ACT inhaler Inhale 2 puffs into the lungs 2 (two) times daily. Patient taking differently: Inhale 2 puffs into the lungs 2 (two) times daily as needed (shortness of breath).  12/21/17   Diallo, 02/20/18, MD  FLOVENT HFA 110 MCG/ACT inhaler TAKE 2 PUFFS BY MOUTH TWICE A DAY Patient taking differently: Inhale 2 puffs into the lungs 2 (two) times daily as needed (shortness of breath).  07/03/18   Diallo, 07/05/18, MD   fluticasone (FLONASE) 50 MCG/ACT nasal spray Place 2 sprays into both nostrils daily. During your bad season Patient taking differently: Place 2 sprays into both nostrils daily as needed for allergies.  12/29/16   Rumley, Lovingston N, DO  ibuprofen (ADVIL) 600 MG tablet Take 1 tablet (600 mg total) by mouth every 6 (six) hours as needed. 08/21/21   13/4/22, MD  lidocaine (XYLOCAINE) 2 % solution Use as directed 10 mLs in the mouth or throat every 6 (six) hours as needed for mouth pain. Hold in mouth and spit. Do not swallow. 08/21/21   13/4/22, MD  montelukast (SINGULAIR) 10 MG tablet TAKE 1 TABLET BY MOUTH EVERYDAY AT BEDTIME Patient taking differently: Take 10 mg by mouth at bedtime.  05/22/18   Diallo, 07/22/18, MD  oxyCODONE-acetaminophen (PERCOCET/ROXICET) 5-325 MG tablet Take 1 tablet by mouth every 6 (six) hours as needed for severe pain. 01/17/22   03/19/22, MD  PROAIR HFA 108 [phone] Base) MCG/ACT inhaler Inhale 2 puffs into the lungs every 6 (six) hours as needed for wheezing or shortness of breath. . 08/31/18   Diallo, 09/02/18, MD  albuterol (PROVENTIL) 90 MCG/ACT inhaler Inhale 2 puffs into the lungs every 4 (four) hours as needed. Using inhaler   09/06/11  [provider]      Allergies    Other, Doxycycline, Penicillins, and Pollen extract    Review of Systems   Review of Systems  Constitutional:  Negative  for chills, diaphoresis, fatigue and fever.  HENT:  Negative for congestion, rhinorrhea and sneezing.   Eyes: Negative.   Respiratory:  Negative for cough, chest tightness and shortness of breath.   Cardiovascular:  Negative for chest pain and leg swelling.  Gastrointestinal:  Positive for abdominal pain, nausea and vomiting. Negative for blood in stool and diarrhea.  Genitourinary:  Negative for difficulty urinating, flank pain, frequency and hematuria.  Musculoskeletal:  Negative for arthralgias and back pain.  Skin:  Negative for rash.   Neurological:  Negative for dizziness, speech difficulty, weakness, numbness and headaches.    Physical Exam Updated Vital Signs BP (!) 130/105   Pulse 73   Temp 97.8 F (36.6 C)   Resp 18   Ht 5\' 1"  (1.549 m)   Wt 90 kg   SpO2 100%   BMI 37.49 kg/m  Physical Exam Constitutional:      Appearance: She is well-developed.     Comments: Appears uncomfortable  HENT:     Head: Normocephalic and atraumatic.  Eyes:     Pupils: Pupils are equal, round, and reactive to light.  Cardiovascular:     Rate and Rhythm: Normal rate and regular rhythm.     Heart sounds: Normal heart sounds.  Pulmonary:     Effort: Pulmonary effort is normal. No respiratory distress.     Breath sounds: Normal breath sounds. No wheezing or rales.  Chest:     Chest wall: No tenderness.  Abdominal:     General: Bowel sounds are normal.     Palpations: Abdomen is soft.     Tenderness: There is abdominal tenderness (Pain in the right upper abdomen). There is no guarding or rebound.  Musculoskeletal:        General: Normal range of motion.     Cervical back: Normal range of motion and neck supple.  Lymphadenopathy:     Cervical: No cervical adenopathy.  Skin:    General: Skin is warm and dry.     Findings: No rash.  Neurological:     Mental Status: She is alert and oriented to person, place, and time.     ED Results / Procedures / Treatments   Labs (all labs ordered are listed, but only abnormal results are displayed) Labs Reviewed  COMPREHENSIVE METABOLIC PANEL - Abnormal; Notable for the following components:      Result Value   AST 12 (*)    All other components within normal limits  CBC - Abnormal; Notable for the following components:   MCV 79.0 (*)    MCH 25.7 (*)    RDW 16.0 (*)    All other components within normal limits  LIPASE, BLOOD  URINALYSIS, ROUTINE W REFLEX MICROSCOPIC  PREGNANCY, URINE    EKG None  Radiology Abdomen Limited RUQ (LIVER/GB)  Result Date:  05/10/2022 CLINICAL DATA:  Right upper quadrant pain with nausea and vomiting for 2 weeks. EXAM: ULTRASOUND ABDOMEN LIMITED RIGHT UPPER QUADRANT COMPARISON:  CT 08/01/2021 FINDINGS: Gallbladder: Cholelithiasis with multiple gallstones filling a contracted gallbladder. Mild gallbladder wall thickening at 4.4 mm. Murphy's sign is positive. Common bile duct: Diameter: 5 mm, normal Liver: No focal lesion identified. Within normal limits in parenchymal echogenicity. Portal vein is patent on color Doppler imaging with normal direction of blood flow towards the liver. Other: None. IMPRESSION: Cholelithiasis with contracted gallbladder and positive Murphy's sign. Changes are consistent with acute cholecystitis in the appropriate clinical setting. Electronically Signed   By: 08/03/2021.D.  On: 05/10/2022 19:10    Procedures Procedures    Medications Ordered in ED Medications  sodium chloride 0.9 % bolus 1,000 mL (1,000 mLs Intravenous New Bag/Given 05/10/22 1830)  morphine (PF) 4 MG/ML injection 4 mg (4 mg Intravenous Given 05/10/22 1826)  ondansetron (ZOFRAN) injection 4 mg (4 mg Intravenous Given 05/10/22 1827)  pantoprazole (PROTONIX) injection 40 mg (40 mg Intravenous Given 05/10/22 1827)    ED Course/ Medical Decision Making/ A&P                           Medical Decision Making Amount and/or Complexity of Data Reviewed Labs: ordered. Radiology: ordered.  Risk Prescription drug management.   Patient is a 26 year old female who presents with right upper quadrant abdominal pain.  She has known gallstones.  She had an ultrasound which showed mild gallbladder wall thickening consistent with possible cholecystitis.  Her WBC count is normal.  She is afebrile.  Her LFTs are normal.  No evidence of pancreatitis.  She was given morphine and Zofran in the ED and feels much better.  Her pain is currently minimal.  She was given a p.o. trial and tolerates this without issue.  I discussed the case with  Dr. Andrey Campanile.  He said that he is happy to admit the patient to have her gallbladder removed or if she is not amenable to that, she can go home and have close follow-up with surgery.  She this point is refusing admission.  She will call and make a close follow-up appointment with surgery.  Dr. Andrey Campanile also noted that he would send an office message to make an appointment for her.  She was given a prescription for Vicodin and Zofran.  She was strongly urged to make close follow-up with the surgeon.  Return precautions were given.   Final Clinical Impression(s) / ED Diagnoses Final diagnoses:  Calculus of gallbladder with acute cholecystitis without obstruction    Rx / DC Orders ED Discharge Orders          Ordered    HYDROcodone-acetaminophen (NORCO/VICODIN) 5-325 MG tablet  Every 4 hours PRN        05/10/22 2004    ondansetron (ZOFRAN-ODT) 4 MG disintegrating tablet        05/10/22 2004              Rolan Bucco, MD 05/10/22 2026

## 2022-08-09 ENCOUNTER — Other Ambulatory Visit: Payer: Self-pay

## 2022-08-09 ENCOUNTER — Emergency Department (HOSPITAL_COMMUNITY)
Admission: EM | Admit: 2022-08-09 | Discharge: 2022-08-09 | Disposition: A | Discharge status: Home / Self Care | Payer: Medicaid Other | Attending: Emergency Medicine | Admitting: Emergency Medicine

## 2022-08-09 ENCOUNTER — Encounter (HOSPITAL_COMMUNITY): Payer: Self-pay

## 2022-08-09 DIAGNOSIS — F17210 Nicotine dependence, cigarettes, uncomplicated: Secondary | ICD-10-CM | POA: Insufficient documentation

## 2022-08-09 DIAGNOSIS — K029 Dental caries, unspecified: Secondary | ICD-10-CM

## 2022-08-09 DIAGNOSIS — Z7951 Long term (current) use of inhaled steroids: Secondary | ICD-10-CM | POA: Insufficient documentation

## 2022-08-09 DIAGNOSIS — J45909 Unspecified asthma, uncomplicated: Secondary | ICD-10-CM | POA: Insufficient documentation

## 2022-08-09 MED ORDER — LIDOCAINE VISCOUS HCL 2 % MT SOLN: 15.0000 mL | OROMUCOSAL | 0 refills | Status: DC | PRN

## 2022-08-09 MED ORDER — AMOXICILLIN-POT CLAVULANATE 875-125 MG PO TABS: 1.0000 | ORAL_TABLET | Freq: Two times a day (BID) | ORAL | 0 refills | Status: DC

## 2022-08-09 MED ORDER — CHLORHEXIDINE GLUCONATE 0.12 % MT SOLN: 15.0000 mL | Freq: Two times a day (BID) | OROMUCOSAL | 0 refills | Status: DC

## 2022-08-09 MED ORDER — IBUPROFEN 600 MG PO TABS: 600.0000 mg | ORAL_TABLET | Freq: Four times a day (QID) | ORAL | 0 refills | Status: DC | PRN

## 2022-08-09 MED ORDER — IBUPROFEN 800 MG PO TABS
800.0000 mg | ORAL_TABLET | Freq: Once | ORAL | Status: AC
Start: 2022-08-09 — End: 2022-08-09
  Administered 2022-08-09: 800 mg via ORAL
  Filled 2022-08-09: qty 1

## 2022-08-09 MED ORDER — LIDOCAINE VISCOUS HCL 2 % MT SOLN
15.0000 mL | Freq: Once | OROMUCOSAL | Status: AC
Start: 2022-08-09 — End: 2022-08-09
  Administered 2022-08-09: 15 mL via OROMUCOSAL
  Filled 2022-08-09: qty 15

## 2022-08-09 NOTE — ED Provider Notes (Signed)
[facility] EMERGENCY DEPARTMENT Provider Note   CSN: [id number] Arrival date & time: 08/09/22  1624     History   Chief Complaint Chief Complaint  Patient presents with   Dental Pain    HPI Cheryl Bowen is a 26 y.o. female.  Patient presents to the emergency department with a dental complaint. Symptoms began 1 month ago. The patient has tried to alleviate pain with ibuprofen 400 mg last taken 12 hours ago.  Pain rated as severe, characterized as throbbing in nature and located L lower and upper gumline. Patient denies fever, night sweats, chills, difficulty swallowing or opening mouth, SOB, nuchal rigidity or decreased ROM of neck.  Patient does not have a dentist and requests a resource guide at discharge.       Dental Pain   Past Medical History:  Diagnosis Date   Asthma    Migraines    Obesity     Patient Active Problem List   Diagnosis Date Noted   Plantar fasciitis 02/25/2017   Otitis media 10/08/2016   Hip pain, acute, right 09/17/2016   Physical deconditioning 09/17/2016   Bilateral pes planus 09/17/2016   Valgus deformity, not elsewhere classified, unspecified knee 09/17/2016   Prediabetes 05/11/2016   Dysmenorrhea in adolescent 07/16/2015   Bilateral knee pain 01/29/2015   Chest pain 06/28/2014   Hidradenitis 09/21/2011   Morbid obesity ([place]) 05/27/2009   Asthma 05/27/2009   Eczema 10/20/2007   Allergic rhinitis 02/06/2007    History reviewed. No pertinent surgical history.   OB History   No obstetric history on file.      Home Medications    Prior to Admission medications   Medication Sig Start Date End Date Taking? Authorizing Provider  amoxicillin-clavulanate (AUGMENTIN) 875-125 MG tablet Take 1 tablet by mouth every 12 (twelve) hours. 08/09/22  Yes Kassadie Pancake S, PA  beclomethasone (QVAR REDIHALER) 80 MCG/ACT inhaler Inhale 1 puff into the lungs 2 (two) times daily. Patient taking differently: Inhale 1 puff into  the lungs 2 (two) times daily as needed (shortness of breath).  12/16/17   Diallo, Earna Coder, MD  budesonide (PULMICORT FLEXHALER) 180 MCG/ACT inhaler Inhale 2 puffs into the lungs 2 (two) times daily. Patient taking differently: Inhale 2 puffs into the lungs 2 (two) times daily as needed (shortness of breath).  12/21/17   Diallo, Earna Coder, MD  chlorhexidine (PERIDEX) 0.12 % solution Use as directed 15 mLs in the mouth or throat 2 (two) times daily. 08/09/22  Yes Johnpatrick Jenny S, PA  FLOVENT HFA 110 MCG/ACT inhaler TAKE 2 PUFFS BY MOUTH TWICE A DAY Patient taking differently: Inhale 2 puffs into the lungs 2 (two) times daily as needed (shortness of breath).  07/03/18   Diallo, Earna Coder, MD  fluticasone (FLONASE) 50 MCG/ACT nasal spray Place 2 sprays into both nostrils daily. During your bad season Patient taking differently: Place 2 sprays into both nostrils daily as needed for allergies.  12/29/16   Rumley, Burna Cash, DO  HYDROcodone-acetaminophen (NORCO/VICODIN) 5-325 MG tablet Take 1-2 tablets by mouth every 4 (four) hours as needed. 05/10/22   Malvin Johns, MD  ibuprofen (ADVIL) 600 MG tablet Take 1 tablet (600 mg total) by mouth every 6 (six) hours as needed. 08/09/22  Yes Thaddius Manes S, PA  lidocaine (XYLOCAINE) 2 % solution Use as directed 10 mLs in the mouth or throat every 6 (six) hours as needed for mouth pain. Hold in mouth and spit. Do not swallow. 08/21/21   Melynda Ripple, MD  montelukast (SINGULAIR) 10 MG tablet TAKE 1 TABLET BY MOUTH EVERYDAY AT BEDTIME Patient taking differently: Take 10 mg by mouth at bedtime.  05/22/18   Diallo, Lilia Argue, MD  ondansetron (ZOFRAN-ODT) 4 MG disintegrating tablet 4mg  ODT q4 hours prn nausea/vomit 05/10/22   05/12/22, MD  oxyCODONE-acetaminophen (PERCOCET/ROXICET) 5-325 MG tablet Take 1 tablet by mouth every 6 (six) hours as needed for severe pain. 01/17/22   03/19/22, MD  PROAIR HFA 108 [phone] Base) MCG/ACT inhaler Inhale 2 puffs into the lungs  every 6 (six) hours as needed for wheezing or shortness of breath. . 08/31/18   Diallo, 09/02/18, MD  albuterol (PROVENTIL) 90 MCG/ACT inhaler Inhale 2 puffs into the lungs every 4 (four) hours as needed. Using inhaler   09/06/11  [provider]    Family History Family History  Problem Relation Age of Onset   Asthma Mother     Social History Social History   Tobacco Use   Smoking status: Every Day    Packs/day: 0.05    Types: Cigarettes   Smokeless tobacco: Never  Substance Use Topics   Alcohol use: Yes    Comment: Rare   Drug use: No     Allergies   Other, Doxycycline, Penicillins, and Pollen extract   Review of Systems Denies fevers, chills, difficulty swallowing or eating, changes in voice, pain under tongue, nausea, vomiting, lightheadedness or dizziness. No trismus   Physical Exam Updated Vital Signs BP (!) 134/101 (BP Location: Right Arm)   Pulse 70   Temp 98.5 F (36.9 C) (Oral)   Resp 16   Ht 5\' 1"  (1.549 m)   Wt 81.6 kg   LMP 08/08/2022 (Exact Date)   SpO2 100%   BMI 34.01 kg/m   Physical Exam Physical Exam  Constitutional: Pt appears well-developed and well-nourished.  HENT:  Head: Normocephalic.  Right Ear: Tympanic membrane, external ear and ear canal normal.  Left Ear: Tympanic membrane, external ear and ear canal normal.  Nose: Nose normal. Right sinus exhibits no maxillary sinus tenderness and no frontal sinus tenderness. Left sinus exhibits no maxillary sinus tenderness and no frontal sinus tenderness.  Mouth/Throat: Uvula is midline, oropharynx is clear and moist and mucous membranes are normal. No oral lesions. No uvula swelling or lacerations. No oropharyngeal exudate, posterior oropharyngeal edema, posterior oropharyngeal erythema or tonsillar abscesses.  Poor dentition No gingival swelling, fluctuance or induration No gross abscess  No sublingual edema, tenderness to palpation, or sign of Ludwig's angina, or deep space  infection Pain at Left lower gumline some ? Swelling. No trismus. No sublingual TTP.  Eyes: Conjunctivae are normal. Pupils are equal, round, and reactive to light. Right eye exhibits no discharge. Left eye exhibits no discharge.  Neck: Normal range of motion. Neck supple.  No stridor Handling secretions without difficulty No nuchal rigidity No cervical lymphadenopathy Cardiovascular: Normal rate, regular rhythm and normal heart sounds.   Pulmonary/Chest: Effort normal. No respiratory distress.  Equal chest rise  Abdominal: Soft. Bowel sounds are normal. Pt exhibits no distension. There is no tenderness.  Lymphadenopathy: Pt has no cervical adenopathy.  Neurological: Pt is alert and oriented x 4  Skin: Skin is warm and dry.  Psychiatric: Pt has a normal mood and affect.  Nursing note and vitals reviewed.   ED Treatments / Results  Labs (all labs ordered are listed, but only abnormal results are displayed) Labs Reviewed - No data to display  EKG    Radiology No results found.  Procedures  Procedures (including critical care time)  Medications Ordered in ED Medications - No data to display   Initial Impression / Assessment and Plan / ED Course  I have reviewed the triage vital signs and the nursing notes.  Pertinent labs & imaging results that were available during my care of the patient were reviewed by me and considered in my medical decision making (see chart for details).        Patient with dentalgia.  No abscess requiring immediate incision and drainage.  Exam not concerning for Ludwig's angina or pharyngeal abscess.  Will treat with augmentin. Pt instructed to follow-up with dentist.  Discussed return precautions. Pt safe for discharge.   Final Clinical Impressions(s) / ED Diagnoses   Final diagnoses:  Infected dental caries    ED Discharge Orders          Ordered    amoxicillin-clavulanate (AUGMENTIN) 875-125 MG tablet  Every 12 hours        08/09/22  1811    chlorhexidine (PERIDEX) 0.12 % solution  2 times daily        08/09/22 1811    ibuprofen (ADVIL) 600 MG tablet  Every 6 hours PRN        08/09/22 1811              Gailen Shelter, [place] 08/09/22 1811    Mardene Sayer, MD 08/09/22 1847

## 2022-08-09 NOTE — ED Triage Notes (Signed)
Pt with known issue to L upper teeth c/o pain and swelling worsening over the last week. Pt also c/o headache during triage as well. No recent fevers per pt. No airway or breathing issues during triage.

## 2022-08-09 NOTE — Discharge Instructions (Addendum)
Please follow-up with a dentist  I have given you a course of antibiotics.  Please use the mouth rinse as prescribed.  Tylenol and ibuprofen at home for pain  Please use Tylenol or ibuprofen for pain.  You may use 600 mg ibuprofen every 6 hours or 1000 mg of Tylenol every 6 hours.  You may choose to alternate between the 2.  This would be most effective.  Not to exceed 4 g of Tylenol within 24 hours.  Not to exceed 3200 mg ibuprofen 24 hours.

## 2022-09-17 DIAGNOSIS — Z419 Encounter for procedure for purposes other than remedying health state, unspecified: Secondary | ICD-10-CM | POA: Diagnosis not present

## 2022-10-18 DIAGNOSIS — Z419 Encounter for procedure for purposes other than remedying health state, unspecified: Secondary | ICD-10-CM | POA: Diagnosis not present

## 2022-11-18 DIAGNOSIS — Z419 Encounter for procedure for purposes other than remedying health state, unspecified: Secondary | ICD-10-CM | POA: Diagnosis not present

## 2022-12-14 ENCOUNTER — Encounter (HOSPITAL_BASED_OUTPATIENT_CLINIC_OR_DEPARTMENT_OTHER): Payer: Self-pay | Admitting: Emergency Medicine

## 2022-12-14 ENCOUNTER — Emergency Department (HOSPITAL_BASED_OUTPATIENT_CLINIC_OR_DEPARTMENT_OTHER): Payer: Medicaid Other | Admitting: Radiology

## 2022-12-14 ENCOUNTER — Emergency Department (HOSPITAL_BASED_OUTPATIENT_CLINIC_OR_DEPARTMENT_OTHER): Payer: Medicaid Other

## 2022-12-14 ENCOUNTER — Emergency Department (HOSPITAL_BASED_OUTPATIENT_CLINIC_OR_DEPARTMENT_OTHER)
Admission: EM | Admit: 2022-12-14 | Discharge: 2022-12-14 | Disposition: A | Discharge status: Home / Self Care | Payer: Medicaid Other | Attending: Emergency Medicine | Admitting: Emergency Medicine

## 2022-12-14 ENCOUNTER — Other Ambulatory Visit: Payer: Self-pay

## 2022-12-14 DIAGNOSIS — Z20822 Contact with and (suspected) exposure to covid-19: Secondary | ICD-10-CM | POA: Diagnosis not present

## 2022-12-14 DIAGNOSIS — Z7951 Long term (current) use of inhaled steroids: Secondary | ICD-10-CM | POA: Insufficient documentation

## 2022-12-14 DIAGNOSIS — K819 Cholecystitis, unspecified: Secondary | ICD-10-CM | POA: Diagnosis not present

## 2022-12-14 DIAGNOSIS — R109 Unspecified abdominal pain: Secondary | ICD-10-CM | POA: Diagnosis not present

## 2022-12-14 DIAGNOSIS — R059 Cough, unspecified: Secondary | ICD-10-CM | POA: Diagnosis not present

## 2022-12-14 DIAGNOSIS — R0789 Other chest pain: Secondary | ICD-10-CM | POA: Diagnosis not present

## 2022-12-14 DIAGNOSIS — R062 Wheezing: Secondary | ICD-10-CM | POA: Diagnosis not present

## 2022-12-14 DIAGNOSIS — R1011 Right upper quadrant pain: Secondary | ICD-10-CM | POA: Diagnosis not present

## 2022-12-14 DIAGNOSIS — R079 Chest pain, unspecified: Secondary | ICD-10-CM | POA: Diagnosis not present

## 2022-12-14 DIAGNOSIS — K8020 Calculus of gallbladder without cholecystitis without obstruction: Secondary | ICD-10-CM | POA: Diagnosis not present

## 2022-12-14 LAB — BASIC METABOLIC PANEL
Anion gap: 7 (ref 5–15)
BUN: 13 mg/dL (ref 6–20)
CO2: 24 mmol/L (ref 22–32)
Calcium: 9.3 mg/dL (ref 8.9–10.3)
Chloride: 109 mmol/L (ref 98–111)
Creatinine, Ser: 0.58 mg/dL (ref 0.44–1.00)
GFR, Estimated: >60 mL/min (ref >60)
Glucose, Bld: 101 mg/dL — ABNORMAL HIGH (ref 70–99)
Potassium: 3.6 mmol/L (ref 3.5–5.1)
Sodium: 140 mmol/L (ref 135–145)

## 2022-12-14 LAB — CBC
HCT: 32.7 % — ABNORMAL LOW (ref 36.0–46.0)
Hemoglobin: 10.6 g/dL — ABNORMAL LOW (ref 12.0–15.0)
MCH: 26 pg (ref 26.0–34.0)
MCHC: 32.4 g/dL (ref 30.0–36.0)
MCV: 80.3 fL (ref 80.0–100.0)
Platelets: 243 10*3/uL (ref 150–400)
RBC: 4.07 MIL/uL (ref 3.87–5.11)
RDW: 15.5 % (ref 11.5–15.5)
WBC: 5.7 10*3/uL (ref 4.0–10.5)
nRBC: 0 % (ref 0.0–0.2)

## 2022-12-14 LAB — RESP PANEL BY RT-PCR (RSV, FLU A&B, COVID)  RVPGX2
Influenza A by PCR: NEGATIVE
Influenza B by PCR: NEGATIVE
Resp Syncytial Virus by PCR: NEGATIVE
SARS Coronavirus 2 by RT PCR: NEGATIVE

## 2022-12-14 LAB — TROPONIN I (HIGH SENSITIVITY): Troponin I (High Sensitivity): 3 ng/L (ref <18)

## 2022-12-14 LAB — PREGNANCY, URINE: Preg Test, Ur: NEGATIVE

## 2022-12-14 MED ORDER — ALBUTEROL SULFATE (2.5 MG/3ML) 0.083% IN NEBU
INHALATION_SOLUTION | RESPIRATORY_TRACT | Status: AC
  Administered 2022-12-14: 5 mg
  Filled 2022-12-14: qty 6

## 2022-12-14 NOTE — ED Triage Notes (Signed)
Pt via pov from home with chest and right flank pain x 1 week. Pt reports that she has been told she has gallstones and believes the flank pain is from her gallbladder because it radiates "up my back." Pt reports more frequent urination than normal, but no pain upon urination. Pt has chest pain when she breathes or coughs. Pt alert & oriented, nad noted.

## 2022-12-14 NOTE — Discharge Instructions (Addendum)
You were evaluated today for right upper quadrant pain along with cough.  Your workup did reveal cholecystitis.  As discussed you wish to follow-up as an outpatient for this.  This is something that may be surgical.  Please take anti-inflammatory medications.  If your pain worsens you develop a fever, or other life-threatening symptoms please immediately return to the emergency department.

## 2022-12-14 NOTE — ED Provider Notes (Signed)
[facility] Provider Note   CSN: [id number] Arrival date & time: 12/14/22  1555     History  Chief Complaint  Patient presents with   Flank Pain   Chest Pain    Anyi Bowen is a 27 y.o. female.  Patient presents emergency room complaining of right-sided flank pain which is been ongoing for 1 week.  She states she has been told in the past that she has gallstones and believes that she likely needs surgery but is unwilling to have it at this time due to other commitments.  Patient also endorses cough and wheezing.  Patient denies shortness of breath, chest pain, dysuria, headache.  Past medical history significant for obesity, asthma, previous diagnosis of cholecystitis HPI     Home Medications Prior to Admission medications   Medication Sig Start Date End Date Taking? Authorizing Provider  amoxicillin-clavulanate (AUGMENTIN) 875-125 MG tablet Take 1 tablet by mouth every 12 (twelve) hours. 08/09/22   Tedd Sias, PA  beclomethasone (QVAR REDIHALER) 80 MCG/ACT inhaler Inhale 1 puff into the lungs 2 (two) times daily. Patient taking differently: Inhale 1 puff into the lungs 2 (two) times daily as needed (shortness of breath).  12/16/17   Diallo, Earna Coder, MD  budesonide (PULMICORT FLEXHALER) 180 MCG/ACT inhaler Inhale 2 puffs into the lungs 2 (two) times daily. Patient taking differently: Inhale 2 puffs into the lungs 2 (two) times daily as needed (shortness of breath).  12/21/17   Diallo, Earna Coder, MD  chlorhexidine (PERIDEX) 0.12 % solution Use as directed 15 mLs in the mouth or throat 2 (two) times daily. 08/09/22   Fondaw, Wylder S, PA  FLOVENT HFA 110 MCG/ACT inhaler TAKE 2 PUFFS BY MOUTH TWICE A DAY Patient taking differently: Inhale 2 puffs into the lungs 2 (two) times daily as needed (shortness of breath).  07/03/18   Diallo, Earna Coder, MD  fluticasone (FLONASE) 50 MCG/ACT nasal spray Place 2 sprays into both nostrils daily. During  your bad season Patient taking differently: Place 2 sprays into both nostrils daily as needed for allergies.  12/29/16   Rumley, Burna Cash, DO  HYDROcodone-acetaminophen (NORCO/VICODIN) 5-325 MG tablet Take 1-2 tablets by mouth every 4 (four) hours as needed. 05/10/22   Malvin Johns, MD  ibuprofen (ADVIL) 600 MG tablet Take 1 tablet (600 mg total) by mouth every 6 (six) hours as needed. 08/09/22   Tedd Sias, PA  lidocaine (XYLOCAINE) 2 % solution Use as directed 15 mLs in the mouth or throat as needed for mouth pain. 08/09/22   Fondaw, Wylder S, PA  montelukast (SINGULAIR) 10 MG tablet TAKE 1 TABLET BY MOUTH EVERYDAY AT BEDTIME Patient taking differently: Take 10 mg by mouth at bedtime.  05/22/18   Diallo, Earna Coder, MD  ondansetron (ZOFRAN-ODT) 4 MG disintegrating tablet '4mg'$  ODT q4 hours prn nausea/vomit 05/10/22   Malvin Johns, MD  oxyCODONE-acetaminophen (PERCOCET/ROXICET) 5-325 MG tablet Take 1 tablet by mouth every 6 (six) hours as needed for severe pain. 01/17/22   Malvin Johns, MD  PROAIR HFA 108 [phone] Base) MCG/ACT inhaler Inhale 2 puffs into the lungs every 6 (six) hours as needed for wheezing or shortness of breath. . 08/31/18   Diallo, Earna Coder, MD  albuterol (PROVENTIL) 90 MCG/ACT inhaler Inhale 2 puffs into the lungs every 4 (four) hours as needed. Using inhaler   09/06/11  [provider]      Allergies    Other, Doxycycline, Penicillins, and Pollen extract    Review of Systems  Review of Systems  Respiratory:  Positive for wheezing.   Cardiovascular:  Negative for chest pain.  Gastrointestinal:  Positive for abdominal pain. Negative for nausea and vomiting.    Physical Exam Updated Vital Signs BP 132/87 (BP Location: Right Arm)   Pulse 66   Temp 98.2 F (36.8 C) (Oral)   Resp 14   Ht '5\' 1"'$  (1.549 m)   Wt 91.6 kg   LMP 12/10/2022 (Approximate)   SpO2 98%   BMI 38.17 kg/m  Physical Exam Vitals and nursing note reviewed.  Constitutional:      General:  She is not in acute distress.    Appearance: She is well-developed.  HENT:     Head: Normocephalic and atraumatic.  Eyes:     Conjunctiva/sclera: Conjunctivae normal.  Cardiovascular:     Rate and Rhythm: Normal rate and regular rhythm.     Heart sounds: No murmur heard. Pulmonary:     Effort: Pulmonary effort is normal. No respiratory distress.     Breath sounds: Examination of the left-lower field reveals wheezing. Wheezing present.  Abdominal:     Palpations: Abdomen is soft.     Tenderness: There is abdominal tenderness (Right upper quadrant).  Musculoskeletal:        General: No swelling.     Cervical back: Neck supple.  Skin:    General: Skin is warm and dry.     Capillary Refill: Capillary refill takes less than 2 seconds.  Neurological:     Mental Status: She is alert.  Psychiatric:        Mood and Affect: Mood normal.     ED Results / Procedures / Treatments   Labs (all labs ordered are listed, but only abnormal results are displayed) Labs Reviewed  BASIC METABOLIC PANEL - Abnormal; Notable for the following components:      Result Value   Glucose, Bld 101 (*)    All other components within normal limits  CBC - Abnormal; Notable for the following components:   Hemoglobin 10.6 (*)    HCT 32.7 (*)    All other components within normal limits  RESP PANEL BY RT-PCR (RSV, FLU A&B, COVID)  RVPGX2  PREGNANCY, URINE  TROPONIN I (HIGH SENSITIVITY)  TROPONIN I (HIGH SENSITIVITY)    EKG None  Radiology US Abdomen Limited RUQ (LIVER/GB)  Result Date: 12/14/2022 CLINICAL DATA:  Right upper quadrant pain EXAM: ULTRASOUND ABDOMEN LIMITED RIGHT UPPER QUADRANT COMPARISON:  Abdominal ultrasound May 10, 2022 FINDINGS: Gallbladder: Cholelithiasis. The gallbladder wall is mildly thickened measuring 4.4 mm. A positive Murphy's sign is reported. Sludge also identified in the gallbladder. No pericholecystic fluid. Common bile duct: Diameter: 5.5 mm Liver: No focal lesion  identified. Within normal limits in parenchymal echogenicity. Portal vein is patent on color Doppler imaging with normal direction of blood flow towards the liver. Other: None. IMPRESSION: 1. Cholelithiasis, mild gallbladder wall thickening, and a positive Murphy's sign is consistent with acute cholecystitis in the setting of acute pain. If the clinical picture is ambiguous, a HIDA scan could further evaluate. Electronically Signed   By: Dorise Bullion III M.D.   On: 12/14/2022 19:42   DG Chest 2 View  Result Date: 12/14/2022 CLINICAL DATA:  Chest pain.  Cough.  Right flank pain for 1 week. EXAM: CHEST - 2 VIEW COMPARISON:  10/17/2018 FINDINGS: The heart size and mediastinal contours are within normal limits. Both lungs are clear. The visualized skeletal structures are unremarkable. IMPRESSION: No active cardiopulmonary disease. Electronically Signed  By: Kerby Moors M.D.   On: 12/14/2022 16:42    Procedures Procedures    Medications Ordered in ED Medications  albuterol (PROVENTIL) (2.5 MG/3ML) 0.083% nebulizer solution (5 mg  Given 12/14/22 1840)    ED Course/ Medical Decision Making/ A&P                             Medical Decision Making Amount and/or Complexity of Data Reviewed Labs: ordered. Radiology: ordered.   This patient presents to the ED for concern of right flank/right upper quadrant pain, this involves an extensive number of treatment options, and is a complaint that carries with it a high risk of complications and morbidity.  The differential diagnosis includes cholecystitis, choledocholithiasis, cholelithiasis, biliary colic, others   Co morbidities that complicate the patient evaluation  History of previous diagnosis of cholecystitis, asthma   Additional history obtained:   External records from outside source obtained and reviewed including emergency department notes and imaging results from the past summer where the patient was diagnosed with  cholecystitis.   Lab Tests:  I Ordered, and personally interpreted labs.  The pertinent results include: Negative respiratory panel, hemoglobin 10.6, unremarkable BMP, troponin 3, negative pregnancy test   Imaging Studies ordered:  I ordered imaging studies including chest x-ray, right upper quadrant ultrasound I independently visualized and interpreted imaging which showed no active disease on chest x-ray. 1. Cholelithiasis, mild gallbladder wall thickening, and a positive  Murphy's sign is consistent with acute cholecystitis in the setting  of acute pain.   I agree with the radiologist interpretation   Cardiac Monitoring: / EKG:  The patient was maintained on a cardiac monitor.  I personally viewed and interpreted the cardiac monitored which showed an underlying rhythm of: Normal sinus rhythm   Problem List / ED Course / Critical interventions / Medication management   I ordered medication including albuterol for wheezing Reevaluation of the patient after these medicines showed that the patient improved I have reviewed the patients home medicines and have made adjustments as needed   Social Determinants of Health:  Patient was medicated for primary insurance   Test / Admission - Considered:  Discussed cholecystitis with the patient.  Her imaging today is almost identical to the imaging performed this past summer.  She states that her pain is tolerable at this time.  She is afebrile with no white count.  I discussed possible admission/surgical consultation but the patient denied at this time stating that she would like to follow-up as an outpatient which was also discussed and recommended in July of this year.  The patient did have wheezing upon arrival and was given albuterol which seemed to improve her symptoms.  Patient will resume her home albuterol usage as needed.  Feel with patient's similar presentation to this past summer, lack of white count, lack of nausea and  vomiting, lack of fever, it is reasonable for the patient to follow-up with surgery as an outpatient.  Patient also states she must have some teeth removed now that she has insurance prior to considering having her gallbladder removed.  Patient discharged home with understanding/strict return precautions including worsening abdominal pain, nausea, vomiting, fevers.        Final Clinical Impression(s) / ED Diagnoses Final diagnoses:  Wheezing  Cholecystitis    Rx / DC Orders ED Discharge Orders     None         Ronny Bacon 12/14/22  [place], [place], DO 12/15/22 0001

## 2022-12-14 NOTE — ED Notes (Signed)
DC teaching reviewed with patient by provider. Pt understands and agrees to plan. No concerns. Pt decides to leave and does not stay for paperwork to be given.

## 2022-12-16 ENCOUNTER — Telehealth: Payer: Self-pay | Admitting: Obstetrics and Gynecology

## 2022-12-16 NOTE — Transitions of Care (Post Inpatient/ED Visit) (Signed)
   12/16/2022  Name: Oline Kinzler MRN: [id number] DOB: [birth year]  Today's TOC FU Call Status: Today's TOC FU Call Status:: Unsuccessul Call (1st Attempt) Unsuccessful Call (1st Attempt) Date: 12/16/22  Attempted to reach the patient regarding the most recent Inpatient/ED visit.  Follow Up Plan: Additional outreach attempts will be made to reach the patient to complete the Transitions of Care (Post Inpatient/ED visit) call.   Aida Raider RN, BSN [place]  [facility] - Managed Medicaid High Risk [phone]

## 2022-12-17 DIAGNOSIS — Z419 Encounter for procedure for purposes other than remedying health state, unspecified: Secondary | ICD-10-CM | POA: Diagnosis not present

## 2023-01-17 DIAGNOSIS — Z419 Encounter for procedure for purposes other than remedying health state, unspecified: Secondary | ICD-10-CM | POA: Diagnosis not present

## 2023-02-16 DIAGNOSIS — Z419 Encounter for procedure for purposes other than remedying health state, unspecified: Secondary | ICD-10-CM | POA: Diagnosis not present

## 2023-03-19 DIAGNOSIS — Z419 Encounter for procedure for purposes other than remedying health state, unspecified: Secondary | ICD-10-CM | POA: Diagnosis not present

## 2023-04-18 DIAGNOSIS — Z419 Encounter for procedure for purposes other than remedying health state, unspecified: Secondary | ICD-10-CM | POA: Diagnosis not present

## 2023-05-19 DIAGNOSIS — Z419 Encounter for procedure for purposes other than remedying health state, unspecified: Secondary | ICD-10-CM | POA: Diagnosis not present

## 2023-06-19 DIAGNOSIS — Z419 Encounter for procedure for purposes other than remedying health state, unspecified: Secondary | ICD-10-CM | POA: Diagnosis not present

## 2023-07-19 DIAGNOSIS — Z419 Encounter for procedure for purposes other than remedying health state, unspecified: Secondary | ICD-10-CM | POA: Diagnosis not present

## 2023-08-19 DIAGNOSIS — Z419 Encounter for procedure for purposes other than remedying health state, unspecified: Secondary | ICD-10-CM | POA: Diagnosis not present

## 2023-09-18 DIAGNOSIS — Z419 Encounter for procedure for purposes other than remedying health state, unspecified: Secondary | ICD-10-CM | POA: Diagnosis not present

## 2023-10-19 DIAGNOSIS — Z419 Encounter for procedure for purposes other than remedying health state, unspecified: Secondary | ICD-10-CM | POA: Diagnosis not present

## 2023-11-12 ENCOUNTER — Emergency Department (HOSPITAL_BASED_OUTPATIENT_CLINIC_OR_DEPARTMENT_OTHER)
Admission: EM | Admit: 2023-11-12 | Discharge: 2023-11-12 | Disposition: A | Discharge status: Home / Self Care | Payer: Medicaid Other | Attending: Emergency Medicine | Admitting: Emergency Medicine

## 2023-11-12 ENCOUNTER — Other Ambulatory Visit: Payer: Self-pay

## 2023-11-12 DIAGNOSIS — M5441 Lumbago with sciatica, right side: Secondary | ICD-10-CM | POA: Diagnosis not present

## 2023-11-12 DIAGNOSIS — M545 Low back pain, unspecified: Secondary | ICD-10-CM

## 2023-11-12 DIAGNOSIS — M5416 Radiculopathy, lumbar region: Secondary | ICD-10-CM | POA: Diagnosis not present

## 2023-11-12 DIAGNOSIS — M722 Plantar fascial fibromatosis: Secondary | ICD-10-CM | POA: Diagnosis not present

## 2023-11-12 DIAGNOSIS — M5442 Lumbago with sciatica, left side: Secondary | ICD-10-CM | POA: Diagnosis not present

## 2023-11-12 LAB — CBG MONITORING, ED: Glucose-Capillary: 85 mg/dL (ref 70–99)

## 2023-11-12 MED ORDER — METHOCARBAMOL 500 MG PO TABS: 500.0000 mg | ORAL_TABLET | Freq: Four times a day (QID) | ORAL | 0 refills | Status: DC

## 2023-11-12 MED ORDER — MELOXICAM 15 MG PO TABS: 15.0000 mg | ORAL_TABLET | Freq: Every day | ORAL | 0 refills | Status: DC

## 2023-11-12 NOTE — ED Provider Notes (Signed)
[place] EMERGENCY DEPARTMENT AT [facility] Provider Note   CSN: [id number] Arrival date & time: 11/12/23  1446     History  No chief complaint on file.   Cheryl Bowen is a 28 y.o. female.  Pt is 28 yo F with PMHx of bilateral pes planus and eczema presenting today with pain in her back and heels. Pt reports she's had back pain for months. The pain starts mid back and shoots down both legs at times. She has tried ibuprofen, ice, heat, and stretching for her back pain with minimal relief. Pt denies any injury to her back or numbness down in her lower extremities. Additionally, 3 days ago she began having pain in her bilateral heels. She states the pain is worse in the morning and after work. 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 She has only tried soaking her feet in epsom salt for the pain. She denies any numbness or tingling in her feet.  The history is provided by the patient.       Home Medications Prior to Admission medications   Medication Sig Start Date End Date Taking? Authorizing Provider  amoxicillin-clavulanate (AUGMENTIN) 875-125 MG tablet Take 1 tablet by mouth every 12 (twelve) hours. 08/09/22   Gailen Shelter, PA  beclomethasone (QVAR REDIHALER) 80 MCG/ACT inhaler Inhale 1 puff into the lungs 2 (two) times daily. Patient taking differently: Inhale 1 puff into the lungs 2 (two) times daily as needed (shortness of breath).  12/16/17   Diallo, Lilia Argue, MD  budesonide (PULMICORT FLEXHALER) 180 MCG/ACT inhaler Inhale 2 puffs into the lungs 2 (two) times daily. Patient taking differently: Inhale 2 puffs into the lungs 2 (two) times daily as needed (shortness of breath).  12/21/17   Diallo, Lilia Argue, MD  chlorhexidine (PERIDEX) 0.12 % solution Use as directed 15 mLs in the mouth or throat 2 (two) times daily. 08/09/22   Fondaw, Wylder S, PA  FLOVENT HFA 110 MCG/ACT inhaler TAKE 2 PUFFS BY MOUTH TWICE A DAY Patient taking differently: Inhale 2 puffs into the  lungs 2 (two) times daily as needed (shortness of breath).  07/03/18   Diallo, Lilia Argue, MD  fluticasone (FLONASE) 50 MCG/ACT nasal spray Place 2 sprays into both nostrils daily. During your bad season Patient taking differently: Place 2 sprays into both nostrils daily as needed for allergies.  12/29/16   Rumley, Lora Havens, DO  HYDROcodone-acetaminophen (NORCO/VICODIN) 5-325 MG tablet Take 1-2 tablets by mouth every 4 (four) hours as needed. 05/10/22   Rolan Bucco, MD  ibuprofen (ADVIL) 600 MG tablet Take 1 tablet (600 mg total) by mouth every 6 (six) hours as needed. 08/09/22   Gailen Shelter, PA  lidocaine (XYLOCAINE) 2 % solution Use as directed 15 mLs in the mouth or throat as needed for mouth pain. 08/09/22   Fondaw, Wylder S, PA  montelukast (SINGULAIR) 10 MG tablet TAKE 1 TABLET BY MOUTH EVERYDAY AT BEDTIME Patient taking differently: Take 10 mg by mouth at bedtime.  05/22/18   Diallo, Lilia Argue, MD  ondansetron (ZOFRAN-ODT) 4 MG disintegrating tablet 4mg  ODT q4 hours prn nausea/vomit 05/10/22   Rolan Bucco, MD  oxyCODONE-acetaminophen (PERCOCET/ROXICET) 5-325 MG tablet Take 1 tablet by mouth every 6 (six) hours as needed for severe pain. 01/17/22   Rolan Bucco, MD  PROAIR HFA 108 [phone] Base) MCG/ACT inhaler Inhale 2 puffs into the lungs every 6 (six) hours as needed for wheezing or shortness of breath. . 08/31/18   Lovena Neighbours, MD  albuterol (PROVENTIL) 90 MCG/ACT inhaler Inhale 2 puffs into the lungs every 4 (four) hours as needed. Using inhaler   09/06/11  [provider]      Allergies    Other, Doxycycline, Penicillins, and Pollen extract    Review of Systems   Review of Systems  Musculoskeletal:  Positive for back pain and myalgias.  All other systems reviewed and are negative.   Physical Exam Updated Vital Signs BP 132/74   Pulse 85   Temp 97.6 F (36.4 C)   Resp 18   Wt 117 kg   SpO2 99%   BMI 48.74 kg/m  Physical Exam Vitals reviewed.   Constitutional:      Appearance: Normal appearance.  Cardiovascular:     Rate and Rhythm: Normal rate.  Pulmonary:     Effort: Pulmonary effort is normal.  Musculoskeletal:        General: Normal range of motion.     Comments: Dry skin bilat feet,  eczema,  tender mid back, pain with movement  Skin:    General: Skin is warm.  Neurological:     General: No focal deficit present.     Mental Status: She is alert.     ED Results / Procedures / Treatments   Labs (all labs ordered are listed, but only abnormal results are displayed) Labs Reviewed  CBG MONITORING, ED    EKG None  Radiology No results found.  Procedures Procedures    Medications Ordered in ED Medications - No data to display  ED Course/ Medical Decision Making/ A&P                                 Medical Decision Making Patient complains of pain in both feet pain is worse in the mornings and improves after walking.  Patient has tenderness in both heels Patient complains of pain in her back she has tried ice heat ibuprofen and stretching without relief  Risk Prescription drug management. Risk Details: Patient counseled on treatment of plantar fasciitis she is advised tennis ball massage.  Orthotics, patient is also advised to use a good moisturizer to help with the eczema on her feet.  Patient is given a prescription for Robaxin and meloxicam for back pain.  Patient is given a note to be out of work for the next 3 days.           Final Clinical Impression(s) / ED Diagnoses Final diagnoses:  Acute low back pain without sciatica, unspecified back pain laterality  Plantar fasciitis, bilateral    Rx / DC Orders ED Discharge Orders          Ordered    meloxicam (MOBIC) 15 MG tablet  Daily        11/12/23 1900    methocarbamol (ROBAXIN) 500 MG tablet  4 times daily        11/12/23 1900          An After Visit Summary was printed and given to the patient.     Osie Cheeks 11/12/23 1901    Glyn Ade, MD 11/12/23 2123

## 2023-11-12 NOTE — ED Triage Notes (Signed)
Back pain x several months. Pain in bottom of heels. Works on [employer]. Denies fall/trauma.

## 2023-11-12 NOTE — ED Triage Notes (Addendum)
charted in error

## 2023-11-12 NOTE — Discharge Instructions (Addendum)
Use a good moisturizer on your feet to help reduce irritation from eczema.  You need to wear inserts in your shoes to help with the plantar fasciitis.

## 2023-11-19 DIAGNOSIS — Z419 Encounter for procedure for purposes other than remedying health state, unspecified: Secondary | ICD-10-CM | POA: Diagnosis not present

## 2023-12-17 DIAGNOSIS — Z419 Encounter for procedure for purposes other than remedying health state, unspecified: Secondary | ICD-10-CM | POA: Diagnosis not present

## 2024-01-28 DIAGNOSIS — Z419 Encounter for procedure for purposes other than remedying health state, unspecified: Secondary | ICD-10-CM | POA: Diagnosis not present

## 2024-02-27 DIAGNOSIS — Z419 Encounter for procedure for purposes other than remedying health state, unspecified: Secondary | ICD-10-CM | POA: Diagnosis not present

## 2024-03-29 DIAGNOSIS — Z419 Encounter for procedure for purposes other than remedying health state, unspecified: Secondary | ICD-10-CM | POA: Diagnosis not present

## 2024-04-28 DIAGNOSIS — Z419 Encounter for procedure for purposes other than remedying health state, unspecified: Secondary | ICD-10-CM | POA: Diagnosis not present

## 2024-05-29 DIAGNOSIS — Z419 Encounter for procedure for purposes other than remedying health state, unspecified: Secondary | ICD-10-CM | POA: Diagnosis not present

## 2024-06-21 ENCOUNTER — Ambulatory Visit (INDEPENDENT_AMBULATORY_CARE_PROVIDER_SITE_OTHER)

## 2024-06-21 ENCOUNTER — Encounter (HOSPITAL_COMMUNITY): Payer: Self-pay

## 2024-06-21 ENCOUNTER — Ambulatory Visit (HOSPITAL_COMMUNITY)
Admission: EM | Admit: 2024-06-21 | Discharge: 2024-06-21 | Disposition: A | Discharge status: Home / Self Care | Attending: Nurse Practitioner | Admitting: Nurse Practitioner

## 2024-06-21 DIAGNOSIS — R051 Acute cough: Secondary | ICD-10-CM | POA: Diagnosis not present

## 2024-06-21 DIAGNOSIS — R062 Wheezing: Secondary | ICD-10-CM | POA: Diagnosis not present

## 2024-06-21 DIAGNOSIS — R059 Cough, unspecified: Secondary | ICD-10-CM | POA: Diagnosis not present

## 2024-06-21 DIAGNOSIS — R0602 Shortness of breath: Secondary | ICD-10-CM | POA: Diagnosis not present

## 2024-06-21 DIAGNOSIS — J4541 Moderate persistent asthma with (acute) exacerbation: Secondary | ICD-10-CM

## 2024-06-21 DIAGNOSIS — J069 Acute upper respiratory infection, unspecified: Secondary | ICD-10-CM | POA: Diagnosis not present

## 2024-06-21 LAB — POC SARS CORONAVIRUS 2 AG -  ED: SARS Coronavirus 2 Ag: NEGATIVE

## 2024-06-21 MED ORDER — FLUTICASONE PROPIONATE 50 MCG/ACT NA SUSP: 2.0000 | Freq: Every day | NASAL | 0 refills | Status: AC | PRN

## 2024-06-21 MED ORDER — METHYLPREDNISOLONE 4 MG PO TBPK: ORAL_TABLET | ORAL | 0 refills | Status: AC

## 2024-06-21 MED ORDER — QVAR REDIHALER 80 MCG/ACT IN AERB: 1.0000 | INHALATION_SPRAY | Freq: Two times a day (BID) | RESPIRATORY_TRACT | 0 refills | Status: AC

## 2024-06-21 MED ORDER — METHYLPREDNISOLONE SODIUM SUCC 125 MG IJ SOLR
80.0000 mg | Freq: Once | INTRAMUSCULAR | Status: AC
  Administered 2024-06-21: 80 mg via INTRAMUSCULAR

## 2024-06-21 MED ORDER — IBUPROFEN 800 MG PO TABS: 800.0000 mg | ORAL_TABLET | Freq: Three times a day (TID) | ORAL | 0 refills | Status: AC | PRN

## 2024-06-21 MED ORDER — METHYLPREDNISOLONE SODIUM SUCC 125 MG IJ SOLR
INTRAMUSCULAR | Status: AC
  Filled 2024-06-21: qty 2

## 2024-06-21 MED ORDER — AZITHROMYCIN 500 MG PO TABS: 500.0000 mg | ORAL_TABLET | Freq: Every day | ORAL | 0 refills | Status: AC

## 2024-06-21 MED ORDER — PSEUDOEPH-BROMPHEN-DM 30-2-10 MG/5ML PO SYRP: 10.0000 mL | ORAL_SOLUTION | Freq: Four times a day (QID) | ORAL | 0 refills | Status: AC | PRN

## 2024-06-21 MED ORDER — IBUPROFEN 800 MG PO TABS
800.0000 mg | ORAL_TABLET | Freq: Once | ORAL | Status: AC
  Administered 2024-06-21: 800 mg via ORAL

## 2024-06-21 MED ORDER — MONTELUKAST SODIUM 10 MG PO TABS: ORAL_TABLET | ORAL | 0 refills | Status: AC

## 2024-06-21 MED ORDER — MUCINEX DM MAXIMUM STRENGTH 60-1200 MG PO TB12: 1.0000 | ORAL_TABLET | Freq: Two times a day (BID) | ORAL | 0 refills | Status: AC

## 2024-06-21 MED ORDER — IPRATROPIUM-ALBUTEROL 0.5-2.5 (3) MG/3ML IN SOLN
RESPIRATORY_TRACT | Status: AC
  Filled 2024-06-21: qty 3

## 2024-06-21 MED ORDER — IBUPROFEN 800 MG PO TABS
ORAL_TABLET | ORAL | Status: AC
  Filled 2024-06-21: qty 1

## 2024-06-21 MED ORDER — PROAIR HFA 108 (90 BASE) MCG/ACT IN AERS: 2.0000 | INHALATION_SPRAY | Freq: Four times a day (QID) | RESPIRATORY_TRACT | 0 refills | Status: AC | PRN

## 2024-06-21 MED ORDER — IPRATROPIUM-ALBUTEROL 0.5-2.5 (3) MG/3ML IN SOLN
3.0000 mL | Freq: Once | RESPIRATORY_TRACT | Status: AC
  Administered 2024-06-21: 3 mL via RESPIRATORY_TRACT

## 2024-06-21 NOTE — ED Triage Notes (Signed)
 Patient presenting with SOB, productive cough, chest tightness, headaches, and hot flashes onset this past Friday. States her twins started school last week and came home sick. States they were tested negative for everything and diagnosed with URI.   Patient has history of asthma but not currently on any medication for this. Took ibuprofen  for headache with slight relief.

## 2024-06-21 NOTE — Discharge Instructions (Signed)
 You were seen today for an asthma flare that has been going on for several days, most likely triggered by a viral illness. Your oxygen level was slightly low at 93% and you had wheezing on exam, but no signs of severe breathing distress. A chest x-ray showed no pneumonia or other acute lung problems, and your COVID-19 test was negative.  In the clinic you received a nebulizer breathing treatment, a steroid injection, and ibuprofen  for fever and body aches. You were prescribed Zithromax , Bromfed-DM, ibuprofen , Mucinex  DM, and a Medrol  Dosepak to help with the current flare. To restart your long-term asthma control, prescriptions have also been provided for Qvar , Flonase , Singulair , and ProAir  until you are able to follow up with a primary care provider.  At home, take your medications exactly as prescribed. Use the rescue inhaler (ProAir ) when you are wheezing or having trouble breathing, but do not overuse it. Use your controller medications daily to help prevent flare-ups. You may also use supportive care such as resting, staying hydrated, using a humidifier, and alternating acetaminophen  or ibuprofen  as needed for fever or body aches. Try to avoid cigarette smoke, marijuana smoke, strong odors, dust, and other known triggers, as these can worsen your asthma.  Please schedule an appointment with a primary care provider soon to continue your asthma care and ensure you have ongoing access to your maintenance medications.  Go to the emergency department immediately if you experience worsening shortness of breath, chest pain, dizziness, confusion, persistent oxygen levels below 94% if you are checking at home, inability to speak in full sentences, inability to keep fluids down, or any sudden worsening of your symptoms.

## 2024-06-21 NOTE — ED Provider Notes (Signed)
 [facility]    CSN: [id number] Arrival date & time: 06/21/24  0803      History   Chief Complaint Chief Complaint  Patient presents with   Cough   Shortness of Breath    HPI Cheryl Bowen is a 28 y.o. female.   Discussed the use of AI scribe software for clinical note transcription with the patient, who gave verbal consent to proceed.   The patient presents with an asthma exacerbation that began on Friday, 06/15/24, approximately six days ago. She reports that her twin siblings returned home from school ill the day before symptom onset, and the following morning she awoke wheezing and experiencing shortness of breath.  She describes her current symptoms as subjective fever, productive cough, chest tightness, wheezing, headaches, chills, body aches, nasal congestion, rhinorrhea, sneezing, and sore throat. She denies nausea, vomiting, or diarrhea. Despite attempts at home management, she reports worsening difficulty controlling her symptoms.  The patient is not currently on maintenance asthma therapy, as she has run out of her prior prescriptions for Singulair , Flovent , Flonase , Pulmicort , and QVAR , and has not had access to a primary care provider for follow-up care. She states she had a metered-dose inhaler but only a few doses remained, which she completed yesterday, giving only brief relief. She also reports tobacco and marijuana use.  The following portions of the patient's history were reviewed and updated as appropriate: allergies, current medications, past family history, past medical history, past social history, past surgical history, and problem list.        Past Medical History:  Diagnosis Date   Asthma    Migraines    Obesity     Patient Active Problem List   Diagnosis Date Noted   Plantar fasciitis 02/25/2017   Otitis media 10/08/2016   Hip pain, acute, right 09/17/2016   Physical deconditioning 09/17/2016   Bilateral pes planus 09/17/2016    Valgus deformity, not elsewhere classified, unspecified knee 09/17/2016   Prediabetes 05/11/2016   Dysmenorrhea in adolescent 07/16/2015   Bilateral knee pain 01/29/2015   Chest pain 06/28/2014   Hidradenitis 09/21/2011   Morbid obesity (HCC) 05/27/2009   Asthma 05/27/2009   Eczema 10/20/2007   Allergic rhinitis 02/06/2007    History reviewed. No pertinent surgical history.  OB History   No obstetric history on file.      Home Medications    Prior to Admission medications   Medication Sig Start Date End Date Taking? Authorizing Provider  azithromycin  (ZITHROMAX ) 500 MG tablet Take 1 tablet (500 mg total) by mouth daily for 5 days. 06/21/24 06/26/24 Yes Iola Lukes, FNP  brompheniramine-pseudoephedrine-DM 30-2-10 MG/5ML syrup Take 10 mLs by mouth every 6 (six) hours as needed (cough and congestion). 06/21/24  Yes Iola Lukes, FNP  Dextromethorphan -guaiFENesin  (MUCINEX  DM MAXIMUM STRENGTH) 60-1200 MG TB12 Take 1 tablet by mouth 2 (two) times daily. 06/21/24  Yes Kaslyn Richburg, FNP  ibuprofen  (ADVIL ) 800 MG tablet Take 1 tablet (800 mg total) by mouth every 8 (eight) hours as needed (pain). Take with food to avoid stomach upset. Do not take any additional NSAIDs while on this. You may take tylenol  in addition to this if needed for extra pain relief. 06/21/24  Yes Iola Lukes, FNP  methylPREDNISolone  (MEDROL  DOSEPAK) 4 MG TBPK tablet Take as directed 06/21/24  Yes Honor Frison, Lukes, FNP  beclomethasone (QVAR  REDIHALER) 80 MCG/ACT inhaler Inhale 1 puff into the lungs 2 (two) times daily. 06/21/24   Iola Lukes, FNP  fluticasone  (FLONASE ) 50 MCG/ACT nasal  spray Place 2 sprays into both nostrils daily as needed for allergies. 06/21/24   Iola Lukes, FNP  montelukast  (SINGULAIR ) 10 MG tablet TAKE 1 TABLET BY MOUTH EVERYDAY AT BEDTIME 06/21/24   Iola Lukes, FNP  PROAIR  HFA 108 (90 Base) MCG/ACT inhaler Inhale 2 puffs into the lungs every 6 (six) hours as needed for  wheezing or shortness of breath. . 06/21/24   Iola Lukes, FNP  albuterol  (PROVENTIL ) 90 MCG/ACT inhaler Inhale 2 puffs into the lungs every 4 (four) hours as needed. Using inhaler   09/06/11  [provider]    Family History Family History  Problem Relation Age of Onset   Asthma Mother     Social History Social History   Tobacco Use   Smoking status: Every Day    Current packs/day: 0.25    Types: Cigarettes   Smokeless tobacco: Never  Substance Use Topics   Alcohol use: Yes    Comment: Rare   Drug use: Yes    Types: Marijuana    Comment: daily     Allergies   Other, Doxycycline , Penicillins, and Pollen extract   Review of Systems Review of Systems  Constitutional:  Positive for fever (subjective).  HENT:  Positive for congestion, rhinorrhea, sneezing and sore throat.   Respiratory:  Positive for cough, chest tightness, shortness of breath and wheezing.   Gastrointestinal:  Negative for diarrhea, nausea and vomiting.  Musculoskeletal:  Positive for myalgias.  Neurological:  Positive for headaches.  All other systems reviewed and are negative.    Physical Exam Triage Vital Signs ED Triage Vitals  Encounter Vitals Group     BP 06/21/24 0826 117/81     Girls Systolic BP Percentile --      Girls Diastolic BP Percentile --      Boys Systolic BP Percentile --      Boys Diastolic BP Percentile --      Pulse Rate 06/21/24 0826 89     Resp 06/21/24 0826 20     Temp 06/21/24 0826 (!) 100.5 F (38.1 C)     Temp Source 06/21/24 0826 Oral     SpO2 06/21/24 0826 93 %     Weight 06/21/24 0825 270 lb (122.5 kg)     Height 06/21/24 0825 5' 1 (1.549 m)     Head Circumference --      Peak Flow --      Pain Score 06/21/24 0824 8     Pain Loc --      Pain Education --      Exclude from Growth Chart --    No data found.  Updated Vital Signs BP 117/81 (BP Location: Right Arm)   Pulse 86   Temp 99.6 F (37.6 C) (Oral)   Resp 18   Ht 5' 1 (1.549 m)    Wt 270 lb (122.5 kg)   LMP 06/18/2024 (Exact Date)   SpO2 98%   BMI 51.02 kg/m   Visual Acuity Right Eye Distance:   Left Eye Distance:   Bilateral Distance:    Right Eye Near:   Left Eye Near:    Bilateral Near:     Physical Exam Vitals reviewed.  Constitutional:      General: She is awake. She is not in acute distress.    Appearance: Normal appearance. She is well-developed. She is obese. She is not ill-appearing, toxic-appearing or diaphoretic.  HENT:     Head: Normocephalic.     Right Ear: Tympanic membrane, ear canal  and external ear normal. No drainage, swelling or tenderness. No middle ear effusion. Tympanic membrane is not erythematous.     Left Ear: Tympanic membrane, ear canal and external ear normal. No drainage, swelling or tenderness.  No middle ear effusion. Tympanic membrane is not erythematous.     Nose: Congestion present. No rhinorrhea.     Mouth/Throat:     Lips: Pink.     Mouth: Mucous membranes are moist.     Pharynx: No pharyngeal swelling, oropharyngeal exudate, posterior oropharyngeal erythema or uvula swelling.     Tonsils: No tonsillar exudate or tonsillar abscesses.  Eyes:     General: Vision grossly intact.     Conjunctiva/sclera: Conjunctivae normal.  Cardiovascular:     Rate and Rhythm: Normal rate and regular rhythm.     Heart sounds: Normal heart sounds.  Pulmonary:     Effort: Pulmonary effort is normal. No tachypnea or respiratory distress.     Breath sounds: Normal air entry. Wheezing (diffused throughout all lung fields) present.     Comments: Respirations even and unlabored  Musculoskeletal:        General: Normal range of motion.     Cervical back: Full passive range of motion without pain, normal range of motion and neck supple.  Lymphadenopathy:     Cervical: Cervical adenopathy present.  Skin:    General: Skin is warm and dry.  Neurological:     General: No focal deficit present.     Mental Status: She is alert and oriented to  person, place, and time.  Psychiatric:        Behavior: Behavior is cooperative.      UC Treatments / Results  Labs (all labs ordered are listed, but only abnormal results are displayed) Labs Reviewed  POC SARS CORONAVIRUS 2 AG -  ED    EKG   Radiology DG Chest 2 View Result Date: 06/21/2024 CLINICAL DATA:  cough, wheeze, SOB x 6 days; hx asthma EXAM: CHEST - 2 VIEW COMPARISON:  12/14/2022 FINDINGS: Cardiomediastinal silhouette and pulmonary vasculature are within normal limits. Lungs are clear. S shaped scoliosis of the thoracolumbar spine. IMPRESSION: No acute cardiopulmonary process. Electronically Signed   By: Aliene Lloyd M.D.   On: 06/21/2024 09:34    Procedures Procedures (including critical care time)  Medications Ordered in UC Medications  ipratropium-albuterol  (DUONEB) 0.5-2.5 (3) MG/3ML nebulizer solution 3 mL (3 mLs Nebulization Given 06/21/24 0907)  methylPREDNISolone  sodium succinate (SOLU-MEDROL ) 125 mg/2 mL injection 80 mg (80 mg Intramuscular Given 06/21/24 0907)  ibuprofen  (ADVIL ) tablet 800 mg (800 mg Oral Given 06/21/24 0905)    Initial Impression / Assessment and Plan / UC Course  I have reviewed the triage vital signs and the nursing notes.  Pertinent labs & imaging results that were available during my care of the patient were reviewed by me and considered in my medical decision making (see chart for details).        The patient presents with an asthma exacerbation ongoing for six days, likely triggered by viral illness exposure from sick contacts at home. She reports fever, cough, wheezing, chest tightness. The patient has been without maintenance asthma medications for some time and had only limited access to an inhaler, which is now depleted. pper respiratory symptoms, and body aches. On exam, oxygen saturation was 93%. She had diffuse wheezing but no tachypnea or respiratory distress. In the clinic she was treated with a nebulizer, intramuscular steroid,  and ibuprofen  for fever and myalgias. COVID-19 testing  was negative.  Chest x-ray negative for any acute cardiopulmonary abnormalities.  Her oxygen level rechecked following treatment.  Patient prescribe Zithromax , Bromfed-DM, ibuprofen , Mucinex  DM and Medrol  Dosepak for current asthma exacerbation.  Additionally, Qvar , Flonase , Singulair  and ProAir  maintenance medications have been prescribed until she is able to connect with a primary care provider.  Instructions for supportive care, avoidance of smoking and other triggers, and the importance of resuming maintenance therapy. The patient was advised to establish care with a primary care provider for long-term management. She was instructed to go to the emergency department if she develops worsening shortness of breath, persistent hypoxia, chest pain, inability to tolerate fluids, or other concerning symptoms.  Today's evaluation has revealed no signs of a dangerous process. Discussed diagnosis with patient and/or guardian. Patient and/or guardian aware of their diagnosis, possible red flag symptoms to watch out for and need for close follow up. Patient and/or guardian understands verbal and written discharge instructions. Patient and/or guardian comfortable with plan and disposition.  Patient and/or guardian has a clear mental status at this time, good insight into illness (after discussion and teaching) and has clear judgment to make decisions regarding their care  Documentation was completed with the aid of voice recognition software. Transcription may contain typographical errors. Final Clinical Impressions(s) / UC Diagnoses   Final diagnoses:  Acute cough  Upper respiratory tract infection, unspecified type  Moderate persistent asthma with exacerbation     Discharge Instructions      You were seen today for an asthma flare that has been going on for several days, most likely triggered by a viral illness. Your oxygen level was slightly low at  93% and you had wheezing on exam, but no signs of severe breathing distress. A chest x-ray showed no pneumonia or other acute lung problems, and your COVID-19 test was negative.  In the clinic you received a nebulizer breathing treatment, a steroid injection, and ibuprofen  for fever and body aches. You were prescribed Zithromax , Bromfed-DM, ibuprofen , Mucinex  DM, and a Medrol  Dosepak to help with the current flare. To restart your long-term asthma control, prescriptions have also been provided for Qvar , Flonase , Singulair , and ProAir  until you are able to follow up with a primary care provider.  At home, take your medications exactly as prescribed. Use the rescue inhaler (ProAir ) when you are wheezing or having trouble breathing, but do not overuse it. Use your controller medications daily to help prevent flare-ups. You may also use supportive care such as resting, staying hydrated, using a humidifier, and alternating acetaminophen  or ibuprofen  as needed for fever or body aches. Try to avoid cigarette smoke, marijuana smoke, strong odors, dust, and other known triggers, as these can worsen your asthma.  Please schedule an appointment with a primary care provider soon to continue your asthma care and ensure you have ongoing access to your maintenance medications.  Go to the emergency department immediately if you experience worsening shortness of breath, chest pain, dizziness, confusion, persistent oxygen levels below 94% if you are checking at home, inability to speak in full sentences, inability to keep fluids down, or any sudden worsening of your symptoms.     ED Prescriptions     Medication Sig Dispense Auth. Provider   beclomethasone (QVAR  REDIHALER) 80 MCG/ACT inhaler Inhale 1 puff into the lungs 2 (two) times daily. 10.6 g Iola Lukes, FNP   fluticasone  (FLONASE ) 50 MCG/ACT nasal spray Place 2 sprays into both nostrils daily as needed for allergies. 16 g McCord, [place],  FNP    montelukast  (SINGULAIR ) 10 MG tablet TAKE 1 TABLET BY MOUTH EVERYDAY AT BEDTIME 30 tablet Iola Lukes, FNP   PROAIR  HFA 108 (90 Base) MCG/ACT inhaler Inhale 2 puffs into the lungs every 6 (six) hours as needed for wheezing or shortness of breath. . 18 g Marck Mcclenny, FNP   azithromycin  (ZITHROMAX ) 500 MG tablet Take 1 tablet (500 mg total) by mouth daily for 5 days. 5 tablet Iola Lukes, FNP   brompheniramine-pseudoephedrine-DM 30-2-10 MG/5ML syrup Take 10 mLs by mouth every 6 (six) hours as needed (cough and congestion). 120 mL Iola Lukes, FNP   ibuprofen  (ADVIL ) 800 MG tablet Take 1 tablet (800 mg total) by mouth every 8 (eight) hours as needed (pain). Take with food to avoid stomach upset. Do not take any additional NSAIDs while on this. You may take tylenol  in addition to this if needed for extra pain relief. 21 tablet Laverne Klugh, [place], FNP   Dextromethorphan -guaiFENesin  (MUCINEX  DM MAXIMUM STRENGTH) 60-1200 MG TB12 Take 1 tablet by mouth 2 (two) times daily. 20 tablet Iola Lukes, FNP   methylPREDNISolone  (MEDROL  DOSEPAK) 4 MG TBPK tablet Take as directed 21 tablet Iola Lukes, FNP      PDMP not reviewed this encounter.   Iola Lukes, [place] 06/21/24 1015

## 2024-06-29 DIAGNOSIS — Z419 Encounter for procedure for purposes other than remedying health state, unspecified: Secondary | ICD-10-CM | POA: Diagnosis not present

## 2024-08-29 DIAGNOSIS — Z419 Encounter for procedure for purposes other than remedying health state, unspecified: Secondary | ICD-10-CM | POA: Diagnosis not present
